# Patient Record
Sex: Female | Born: 1995
Health system: Southern US, Community
[De-identification: ages and names within clinical notes are randomized; demographics above are authoritative.]

## PROBLEM LIST (undated history)

## (undated) DIAGNOSIS — B019 Varicella without complication: Secondary | ICD-10-CM

## (undated) DIAGNOSIS — E063 Autoimmune thyroiditis: Secondary | ICD-10-CM

## (undated) HISTORY — DX: Varicella without complication: B01.9

## (undated) HISTORY — DX: Autoimmune thyroiditis: E06.3

---
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
## 2017-02-13 ENCOUNTER — Other Ambulatory Visit: Payer: Self-pay | Admitting: Internal Medicine

## 2017-02-13 DIAGNOSIS — E039 Hypothyroidism, unspecified: Secondary | ICD-10-CM | POA: Diagnosis not present

## 2017-02-14 LAB — T4 AND TSH
T4, Total: 10.4 ug/dL (ref 4.5–12.0)
TSH: 3.4 u[IU]/mL (ref 0.450–4.500)

## 2017-02-17 ENCOUNTER — Other Ambulatory Visit: Payer: Self-pay | Admitting: Internal Medicine

## 2017-02-18 ENCOUNTER — Other Ambulatory Visit: Payer: Self-pay | Admitting: Emergency Medicine

## 2017-02-18 MED ORDER — LEVOTHYROXINE SODIUM 137 MCG PO TABS: 137.0000 ug | ORAL_TABLET | Freq: Every day | ORAL | 1 refills | Status: DC

## 2017-02-20 ENCOUNTER — Telehealth: Payer: Self-pay | Admitting: Emergency Medicine

## 2017-02-20 DIAGNOSIS — R7611 Nonspecific reaction to tuberculin skin test without active tuberculosis: Secondary | ICD-10-CM

## 2017-02-20 NOTE — Telephone Encounter (Signed)
Pts mother is requesting a written RX for labs to be done before pts appt in Aug.

## 2017-02-21 NOTE — Telephone Encounter (Signed)
Mailed to pt at mothers request.

## 2017-02-21 NOTE — Telephone Encounter (Signed)
rx written

## 2017-02-26 ENCOUNTER — Telehealth: Payer: Self-pay | Admitting: Internal Medicine

## 2017-02-26 NOTE — Telephone Encounter (Signed)
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 Her TB test away shows a false positive. She is wanting a order for the lab & the chest xray to be done to show she does not have TB. She would like to pick up the orders to do her insurance does not cover our lab and xray. She also would like the lab order for her T4 and TSH as well. Please advise. Thank you.   Please contact patient at [phone]

## 2017-02-26 NOTE — Telephone Encounter (Signed)
Lets do the blood test - if positive - will need CXR.  Order pending - can be sent to [facility] from our lab if she wants to come here

## 2017-02-26 NOTE — Telephone Encounter (Signed)
Spoke with pt, states she would still like to come to get the RX for labs after I informed that labs can be sent to [facility]. Please write out RX

## 2017-02-26 NOTE — Telephone Encounter (Signed)
cxr and tb blood test written scripts

## 2017-02-26 NOTE — Telephone Encounter (Signed)
Spoke with pt states she had a pos TB test in 2015. [facility] and will need either the blood work or the chest x-ray for documentation. Please advise on which should be done.

## 2017-02-26 NOTE — Addendum Note (Signed)
Addended by: Pincus SanesBURNS, Janaysia Mcleroy J on: 02/26/2017 10:03 AM   Modules accepted: Orders

## 2017-02-26 NOTE — Telephone Encounter (Signed)
Placed up front for pickup ° °

## 2017-02-26 NOTE — Telephone Encounter (Signed)
Patient called back stating that she needs a script for a chest x ray b/c TB test come back positive.  Requesting to pick this up.  As well as a copy of the scripts that were mailed to her for labs.  States x ray script needs to be a separate script.  Please follow up at [phone].

## 2017-02-26 NOTE — Telephone Encounter (Signed)
See other phone note for Documentation

## 2017-02-27 ENCOUNTER — Other Ambulatory Visit: Payer: Self-pay | Admitting: Internal Medicine

## 2017-02-27 DIAGNOSIS — Z7689 Persons encountering health services in other specified circumstances: Secondary | ICD-10-CM | POA: Diagnosis not present

## 2017-02-27 NOTE — Addendum Note (Signed)
Addended by: Pincus SanesBURNS, Altamese Deguire J on: 02/27/2017 07:30 AM   Modules accepted: Orders

## 2017-03-04 LAB — QUANTIFERON IN TUBE
QUANTIFERON MITOGEN VALUE: 8.34 [IU]/mL
QUANTIFERON TB AG VALUE: 0.03 IU/mL
QUANTIFERON TB GOLD: NEGATIVE
Quantiferon Nil Value: 0.04 IU/mL

## 2017-03-04 LAB — QUANTIFERON TB GOLD ASSAY (BLOOD)

## 2017-03-04 NOTE — Telephone Encounter (Signed)
Per Dr Lawerance BachBurns okay to give results to Melanie Scott. Spoke with Melanie Scott, she will pick results up at front desk.

## 2017-03-04 NOTE — Telephone Encounter (Signed)
Please advise, I dont think the results are back yet.

## 2017-03-04 NOTE — Telephone Encounter (Signed)
Patient is waiting to know what her tb results are. And she would like to pick them up. She needs to turn them in somewhere. Thank you.

## 2017-05-08 ENCOUNTER — Other Ambulatory Visit: Payer: Self-pay | Admitting: Internal Medicine

## 2017-05-08 DIAGNOSIS — E063 Autoimmune thyroiditis: Secondary | ICD-10-CM | POA: Diagnosis not present

## 2017-05-09 LAB — CBC/DIFF AMBIGUOUS DEFAULT
BASOS ABS: 0 10*3/uL (ref 0.0–0.2)
BASOS: 0 %
EOS (ABSOLUTE): 0.6 10*3/uL — ABNORMAL HIGH (ref 0.0–0.4)
EOS: 7 %
HEMOGLOBIN: 13.3 g/dL (ref 11.1–15.9)
Hematocrit: 38.3 % (ref 34.0–46.6)
IMMATURE GRANS (ABS): 0 10*3/uL (ref 0.0–0.1)
IMMATURE GRANULOCYTES: 0 %
Lymphocytes Absolute: 4 10*3/uL — ABNORMAL HIGH (ref 0.7–3.1)
Lymphs: 48 %
MCH: 26.7 pg (ref 26.6–33.0)
MCHC: 34.7 g/dL (ref 31.5–35.7)
MCV: 77 fL — ABNORMAL LOW (ref 79–97)
MONOCYTES: 6 %
Monocytes Absolute: 0.5 10*3/uL (ref 0.1–0.9)
NEUTROS ABS: 3.2 10*3/uL (ref 1.4–7.0)
Neutrophils: 39 %
PLATELETS: 346 10*3/uL (ref 150–379)
RBC: 4.99 x10E6/uL (ref 3.77–5.28)
RDW: 13.1 % (ref 12.3–15.4)
WBC: 8.3 10*3/uL (ref 3.4–10.8)

## 2017-05-09 LAB — LIPID PANEL W/O CHOL/HDL RATIO
Cholesterol, Total: 238 mg/dL — ABNORMAL HIGH (ref 100–169)
HDL: 53 mg/dL (ref >39)
LDL CALC: 155 mg/dL — AB (ref 0–109)
Triglycerides: 150 mg/dL — ABNORMAL HIGH (ref 0–89)
VLDL CHOLESTEROL CAL: 30 mg/dL (ref 5–40)

## 2017-05-09 LAB — COMPREHENSIVE METABOLIC PANEL
ALBUMIN: 3.9 g/dL (ref 3.5–5.5)
ALT: 27 IU/L (ref 0–32)
AST: 23 IU/L (ref 0–40)
Albumin/Globulin Ratio: 1.4 (ref 1.2–2.2)
Alkaline Phosphatase: 90 IU/L (ref 39–117)
BILIRUBIN TOTAL: 0.3 mg/dL (ref 0.0–1.2)
BUN / CREAT RATIO: 13 (ref 9–23)
BUN: 11 mg/dL (ref 6–20)
CALCIUM: 9.6 mg/dL (ref 8.7–10.2)
CHLORIDE: 102 mmol/L (ref 96–106)
CO2: 19 mmol/L — AB (ref 20–29)
CREATININE: 0.83 mg/dL (ref 0.57–1.00)
GFR calc Af Amer: 118 mL/min/{1.73_m2} (ref >59)
GFR, EST NON AFRICAN AMERICAN: 103 mL/min/{1.73_m2} (ref >59)
GLUCOSE: 82 mg/dL (ref 65–99)
Globulin, Total: 2.8 g/dL (ref 1.5–4.5)
Potassium: 4.1 mmol/L (ref 3.5–5.2)
Sodium: 138 mmol/L (ref 134–144)
TOTAL PROTEIN: 6.7 g/dL (ref 6.0–8.5)

## 2017-05-09 LAB — T3, FREE: T3, Free: 2.9 pg/mL (ref 2.3–5.0)

## 2017-05-09 LAB — T4, FREE: FREE T4: 1.55 ng/dL (ref 0.93–1.60)

## 2017-05-09 LAB — AMBIG ABBREV CMP14 DEFAULT

## 2017-05-09 LAB — TSH: TSH: 2.34 u[IU]/mL (ref 0.450–4.500)

## 2017-05-14 NOTE — Progress Notes (Signed)
Subjective:    Patient ID: Melanie MoellerEyla Brandenberger, female    DOB: 07/24/1996, 21 y.o.   MRN: [id number]  HPI She is here for a physical exam.   She exercises irregularly depending on her school schedule.  She does not eat a lot of fried foods, but has a sweet tooth.   Wants to try a different birth control.  She gets nauseous at times with the birth control, especially after drinking alcohol.   Sleep difficulties:  She sometimes has difficulty falling asleep, but that is better.  She is a light sleeper and wakes up easy.  She usually falls asleep again but is not always in a deep sleep.  She tried an otc sleep medication.  She does feel anxious/ stressed at times.   Some anxiety and depression;  She states some variability in mood.   She has some anxiety at times and at times she feels down.  She often can not sleep when she is anxious about something.  She has never been on medication in the past.   Medications and allergies reviewed with patient and updated if appropriate.  Patient Active Problem List   Diagnosis Date Noted  . Adjustment disorder with mixed anxiety and depressed mood 05/15/2017  . Elevated cholesterol 05/23/2016  . Multiple thyroid nodules 12/13/2015  . Eczema 12/06/2015  . Seasonal allergic rhinitis 07/06/2014  . Autoimmune hypothyroidism     Current Outpatient Prescriptions on File Prior to Visit  Medication Sig Dispense Refill  . cetirizine (ZYRTEC) 10 MG tablet Take 1 tablet (10 mg total) by mouth daily. 90 tablet 3  . levothyroxine (SYNTHROID, LEVOTHROID) 137 MCG tablet Take 1 tablet (137 mcg total) by mouth daily before breakfast. 90 tablet 1  . mometasone (ELOCON) 0.1 % cream Apply 1 application topically daily. 45 g 3  . norethindrone-ethinyl estradiol-iron (MICROGESTIN FE 1.5/30) 1.5-30 MG-MCG tablet Take 1 tablet by mouth daily. 3 Package 3  . tacrolimus (PROTOPIC) 0.1 % ointment Apply topically 2 (two) times daily as needed. 100 g 3   No current  facility-administered medications on file prior to visit.     Past Medical History:  Diagnosis Date  . Autoimmune hypothyroidism    s/p R thyridectomy 2011 (benign nodules)  . Chicken pox     Past Surgical History:  Procedure Laterality Date  . left lobe thyroidectomy  03/2016  . Right Lobe Thyroidectomy  2011   benign nodules on thyroid    Social History   Social History  . Marital status: Single    Spouse name: N/A  . Number of children: N/A  . Years of education: 11 years   Occupational History  . high school student    Social History Main Topics  . Smoking status: Never Smoker  . Smokeless tobacco: Never Used  . Alcohol use No  . Drug use: No  . Sexual activity: Not on file   Other Topics Concern  . Not on file   Social History Narrative   Lives with mom, dad and sister   [place]      Regular exercise    Family History  Problem Relation Age of Onset  . Hyperlipidemia Other   . Hypertension Other   . Cancer Other        Breast Cancer    Review of Systems  Constitutional: Positive for fatigue. Negative for chills and fever.  Eyes: Negative for visual disturbance.  Respiratory: Negative for cough, shortness of breath and wheezing.  Cardiovascular: Negative for chest pain, palpitations and leg swelling.  Gastrointestinal: Negative for abdominal pain, blood in stool, constipation, diarrhea and nausea.  Genitourinary: Negative for dysuria and hematuria.  Musculoskeletal: Positive for back pain (with menses). Negative for arthralgias.  Skin: Negative for color change.  Neurological: Negative for light-headedness and headaches.  Psychiatric/Behavioral: Positive for dysphoric mood and sleep disturbance. Negative for decreased concentration. The patient is nervous/anxious.        Objective:   Vitals:   05/15/17 0927  BP: 112/78  Pulse: 77  Resp: 16  Temp: 97.8 F (36.6 C)  SpO2: 98%   Filed Weights   05/15/17 0927  Weight: 148 lb  (67.1 kg)   Body mass index is 27.07 kg/m.  Wt Readings from Last 3 Encounters:  05/15/17 148 lb (67.1 kg) (80 %, Z= 0.82)*  05/23/16 141 lb (64 kg) (75 %, Z= 0.69)*  12/06/15 139 lb (63 kg) (75 %, Z= 0.66)*   * Growth percentiles are based on CDC 2-20 Years data.     Physical Exam Constitutional: She appears well-developed and well-nourished. No distress.  HENT:  Head: Normocephalic and atraumatic.  Right Ear: External ear normal. Normal ear canal and TM Left Ear: External ear normal.  Normal ear canal and TM Mouth/Throat: Oropharynx is clear and moist.  Eyes: Conjunctivae and EOM are normal.  Neck: Neck supple. No tracheal deviation present. No thyromegaly present.  No carotid bruit  Cardiovascular: Normal rate, regular rhythm and normal heart sounds.   No murmur heard.  No edema. Pulmonary/Chest: Effort normal and breath sounds normal. No respiratory distress. She has no wheezes. She has no rales.  Breast: deferred  Abdominal: Soft. She exhibits no distension. There is no tenderness.  Lymphadenopathy: She has no cervical adenopathy.  Skin: Skin is warm and dry. She is not diaphoretic.  Psychiatric: She has a normal mood and affect. Her behavior is normal.         Assessment & Plan:   Physical exam: Screening blood work - reviewed her blood work Immunizations  Up to date  [employer] - has never seen one, not sexually active Wants to try a different birth control - will switch to sprintec Exercise  irregular Weight - overwieght Skin - no concerns Substance abuse    See Problem List for Assessment and Plan of chronic medical problems.

## 2017-05-15 ENCOUNTER — Encounter: Payer: Self-pay | Admitting: Internal Medicine

## 2017-05-15 ENCOUNTER — Ambulatory Visit (INDEPENDENT_AMBULATORY_CARE_PROVIDER_SITE_OTHER): Payer: 59 | Admitting: Internal Medicine

## 2017-05-15 VITALS — BP 112/78 | HR 77 | Temp 97.8°F | Resp 16 | Ht 62.0 in | Wt 148.0 lb

## 2017-05-15 DIAGNOSIS — E063 Autoimmune thyroiditis: Secondary | ICD-10-CM

## 2017-05-15 DIAGNOSIS — F4323 Adjustment disorder with mixed anxiety and depressed mood: Secondary | ICD-10-CM | POA: Diagnosis not present

## 2017-05-15 DIAGNOSIS — Z0001 Encounter for general adult medical examination with abnormal findings: Secondary | ICD-10-CM

## 2017-05-15 DIAGNOSIS — Z0000 Encounter for general adult medical examination without abnormal findings: Secondary | ICD-10-CM

## 2017-05-15 DIAGNOSIS — E7800 Pure hypercholesterolemia, unspecified: Secondary | ICD-10-CM

## 2017-05-15 MED ORDER — LEVOTHYROXINE SODIUM 137 MCG PO TABS: 137.0000 ug | ORAL_TABLET | Freq: Every day | ORAL | 1 refills | Status: DC

## 2017-05-15 MED ORDER — NORGESTIMATE-ETH ESTRADIOL 0.25-35 MG-MCG PO TABS: 1.0000 | ORAL_TABLET | Freq: Every day | ORAL | 11 refills | Status: DC

## 2017-05-15 MED ORDER — MOMETASONE FUROATE 0.1 % EX CREA: 1.0000 "application " | TOPICAL_CREAM | Freq: Every day | CUTANEOUS | 3 refills | Status: DC

## 2017-05-15 NOTE — Patient Instructions (Addendum)
Try melatonin for your sleep.  Take 2 mg - 6 mg at night.   Make sure you are exercising regularly.   We reviewed your blood work.   All other Health Maintenance issues reviewed.   All recommended immunizations and age-appropriate screenings are up-to-date or discussed.  No immunizations administered today.   Medications reviewed and updated.  Changes include switching to a different birth control  Your prescription(s) have been printed and given to you. Please take as directed and contact our office if you believe you are having problem(s) with the medication(s).   Please followup in 6 months   Health Maintenance, Female Adopting a healthy lifestyle and getting preventive care can go a long way to promote health and wellness. Talk with your health care provider about what schedule of regular examinations is right for you. This is a good chance for you to check in with your provider about disease prevention and staying healthy. In between checkups, there are plenty of things you can do on your own. Experts have done a lot of research about which lifestyle changes and preventive measures are most likely to keep you healthy. Ask your health care provider for more information. Weight and diet Eat a healthy diet  Be sure to include plenty of vegetables, fruits, low-fat dairy products, and lean protein.  Do not eat a lot of foods high in solid fats, added sugars, or salt.  Get regular exercise. This is one of the most important things you can do for your health. ? Most adults should exercise for at least 150 minutes each week. The exercise should increase your heart rate and make you sweat (moderate-intensity exercise). ? Most adults should also do strengthening exercises at least twice a week. This is in addition to the moderate-intensity exercise.  Maintain a healthy weight  Body mass index (BMI) is a measurement that can be used to identify possible weight problems. It estimates body  fat based on height and weight. Your health care provider can help determine your BMI and help you achieve or maintain a healthy weight.  For females 56 years of age and older: ? A BMI below 18.5 is considered underweight. ? A BMI of 18.5 to 24.9 is normal. ? A BMI of 25 to 29.9 is considered overweight. ? A BMI of 30 and above is considered obese.  Watch levels of cholesterol and blood lipids  You should start having your blood tested for lipids and cholesterol at 19 years of age, then have this test every 5 years.  You may need to have your cholesterol levels checked more often if: ? Your lipid or cholesterol levels are high. ? You are older than 19 years of age. ? You are at high risk for heart disease.  Cancer screening Lung Cancer  Lung cancer screening is recommended for adults 23-6 years old who are at high risk for lung cancer because of a history of smoking.  A yearly low-dose CT scan of the lungs is recommended for people who: ? Currently smoke. ? Have quit within the past 15 years. ? Have at least a 30-pack-year history of smoking. A pack year is smoking an average of one pack of cigarettes a day for 1 year.  Yearly screening should continue until it has been 15 years since you quit.  Yearly screening should stop if you develop a health problem that would prevent you from having lung cancer treatment.  Breast Cancer  Practice breast self-awareness. This means understanding how  your breasts normally appear and feel.  It also means doing regular breast self-exams. Let your health care provider know about any changes, no matter how small.  If you are in your 20s or 30s, you should have a clinical breast exam (CBE) by a health care provider every 1-3 years as part of a regular health exam.  If you are 15 or older, have a CBE every year. Also consider having a breast X-ray (mammogram) every year.  If you have a family history of breast cancer, talk to your health care  provider about genetic screening.  If you are at high risk for breast cancer, talk to your health care provider about having an MRI and a mammogram every year.  Breast cancer gene (BRCA) assessment is recommended for women who have family members with BRCA-related cancers. BRCA-related cancers include: ? Breast. ? Ovarian. ? Tubal. ? Peritoneal cancers.  Results of the assessment will determine the need for genetic counseling and BRCA1 and BRCA2 testing.  Cervical Cancer Your health care provider may recommend that you be screened regularly for cancer of the pelvic organs (ovaries, uterus, and vagina). This screening involves a pelvic examination, including checking for microscopic changes to the surface of your cervix (Pap test). You may be encouraged to have this screening done every 3 years, beginning at age 38.  For women ages 62-65, health care providers may recommend pelvic exams and Pap testing every 3 years, or they may recommend the Pap and pelvic exam, combined with testing for human papilloma virus (HPV), every 5 years. Some types of HPV increase your risk of cervical cancer. Testing for HPV may also be done on women of any age with unclear Pap test results.  Other health care providers may not recommend any screening for nonpregnant women who are considered low risk for pelvic cancer and who do not have symptoms. Ask your health care provider if a screening pelvic exam is right for you.  If you have had past treatment for cervical cancer or a condition that could lead to cancer, you need Pap tests and screening for cancer for at least 20 years after your treatment. If Pap tests have been discontinued, your risk factors (such as having a new sexual partner) need to be reassessed to determine if screening should resume. Some women have medical problems that increase the chance of getting cervical cancer. In these cases, your health care provider may recommend more frequent screening and  Pap tests.  Colorectal Cancer  This type of cancer can be detected and often prevented.  Routine colorectal cancer screening usually begins at 19 years of age and continues through 19 years of age.  Your health care provider may recommend screening at an earlier age if you have risk factors for colon cancer.  Your health care provider may also recommend using home test kits to check for hidden blood in the stool.  A small camera at the end of a tube can be used to examine your colon directly (sigmoidoscopy or colonoscopy). This is done to check for the earliest forms of colorectal cancer.  Routine screening usually begins at age 5.  Direct examination of the colon should be repeated every 5-10 years through 19 years of age. However, you may need to be screened more often if early forms of precancerous polyps or small growths are found.  Skin Cancer  Check your skin from head to toe regularly.  Tell your health care provider about any new moles or changes  in moles, especially if there is a change in a mole's shape or color.  Also tell your health care provider if you have a mole that is larger than the size of a pencil eraser.  Always use sunscreen. Apply sunscreen liberally and repeatedly throughout the day.  Protect yourself by wearing long sleeves, pants, a wide-brimmed hat, and sunglasses whenever you are outside.  Heart disease, diabetes, and high blood pressure  High blood pressure causes heart disease and increases the risk of stroke. High blood pressure is more likely to develop in: ? People who have blood pressure in the high end of the normal range (130-139/85-89 mm Hg). ? People who are overweight or obese. ? People who are African American.  If you are 34-23 years of age, have your blood pressure checked every 3-5 years. If you are 35 years of age or older, have your blood pressure checked every year. You should have your blood pressure measured twice-once when you  are at a hospital or clinic, and once when you are not at a hospital or clinic. Record the average of the two measurements. To check your blood pressure when you are not at a hospital or clinic, you can use: ? An automated blood pressure machine at a pharmacy. ? A home blood pressure monitor.  If you are between 9 years and 38 years old, ask your health care provider if you should take aspirin to prevent strokes.  Have regular diabetes screenings. This involves taking a blood sample to check your fasting blood sugar level. ? If you are at a normal weight and have a low risk for diabetes, have this test once every three years after 19 years of age. ? If you are overweight and have a high risk for diabetes, consider being tested at a younger age or more often. Preventing infection Hepatitis B  If you have a higher risk for hepatitis B, you should be screened for this virus. You are considered at high risk for hepatitis B if: ? You were born in a country where hepatitis B is common. Ask your health care provider which countries are considered high risk. ? Your parents were born in a high-risk country, and you have not been immunized against hepatitis B (hepatitis B vaccine). ? You have HIV or AIDS. ? You use needles to inject street drugs. ? You live with someone who has hepatitis B. ? You have had sex with someone who has hepatitis B. ? You get hemodialysis treatment. ? You take certain medicines for conditions, including cancer, organ transplantation, and autoimmune conditions.  Hepatitis C  Blood testing is recommended for: ? Everyone born from [birth year] through 1963. ? Anyone with known risk factors for hepatitis C.  Sexually transmitted infections (STIs)  You should be screened for sexually transmitted infections (STIs) including gonorrhea and chlamydia if: ? You are sexually active and are younger than 19 years of age. ? You are older than 19 years of age and your health care provider  tells you that you are at risk for this type of infection. ? Your sexual activity has changed since you were last screened and you are at an increased risk for chlamydia or gonorrhea. Ask your health care provider if you are at risk.  If you do not have HIV, but are at risk, it may be recommended that you take a prescription medicine daily to prevent HIV infection. This is called pre-exposure prophylaxis (PrEP). You are considered at risk if: ? You  are sexually active and do not regularly use condoms or know the HIV status of your partner(s). ? You take drugs by injection. ? You are sexually active with a partner who has HIV.  Talk with your health care provider about whether you are at high risk of being infected with HIV. If you choose to begin PrEP, you should first be tested for HIV. You should then be tested every 3 months for as long as you are taking PrEP. Pregnancy  If you are premenopausal and you may become pregnant, ask your health care provider about preconception counseling.  If you may become pregnant, take 400 to 800 micrograms (mcg) of folic acid every day.  If you want to prevent pregnancy, talk to your health care provider about birth control (contraception). Osteoporosis and menopause  Osteoporosis is a disease in which the bones lose minerals and strength with aging. This can result in serious bone fractures. Your risk for osteoporosis can be identified using a bone density scan.  If you are 29 years of age or older, or if you are at risk for osteoporosis and fractures, ask your health care provider if you should be screened.  Ask your health care provider whether you should take a calcium or vitamin D supplement to lower your risk for osteoporosis.  Menopause may have certain physical symptoms and risks.  Hormone replacement therapy may reduce some of these symptoms and risks. Talk to your health care provider about whether hormone replacement therapy is right for  you. Follow these instructions at home:  Schedule regular health, dental, and eye exams.  Stay current with your immunizations.  Do not use any tobacco products including cigarettes, chewing tobacco, or electronic cigarettes.  If you are pregnant, do not drink alcohol.  If you are breastfeeding, limit how much and how often you drink alcohol.  Limit alcohol intake to no more than 1 drink per day for nonpregnant women. One drink equals 12 ounces of beer, 5 ounces of wine, or 1 ounces of hard liquor.  Do not use street drugs.  Do not share needles.  Ask your health care provider for help if you need support or information about quitting drugs.  Tell your health care provider if you often feel depressed.  Tell your health care provider if you have ever been abused or do not feel safe at home. This information is not intended to replace advice given to you by your health care provider. Make sure you discuss any questions you have with your health care provider. Document Released: 04/08/2011 Document Revised: 02/29/2016 Document Reviewed: 06/27/2015 Elsevier Interactive Patient Education  [employer].

## 2017-05-15 NOTE — Assessment & Plan Note (Signed)
She is experiencing some anxiety and depression - it is mild Stressed regular exercise and stress relieving activities Discussed medication - she deferred for now

## 2017-05-15 NOTE — Assessment & Plan Note (Signed)
Elevated - possibly genetically driven No medication at this age Stressed regular exercise Low fat/cholesterol diet

## 2017-05-15 NOTE — Assessment & Plan Note (Signed)
Tsh, ft3 and ft4 in normal range Continue levothyroxine 137 mcg

## 2017-07-01 ENCOUNTER — Telehealth: Payer: Self-pay | Admitting: Internal Medicine

## 2017-07-01 NOTE — Telephone Encounter (Signed)
Pt called in and has some question about her meds that was sent in. norgestimate-ethinyl estradiol (SPRINTEC 28) 0.25-35 MG-MCG tablet [[id number]  Need 3 month supply per ins  Has some question.  Would like a nurse to call her   Best number [phone]

## 2017-07-02 MED ORDER — NORGESTIMATE-ETH ESTRADIOL 0.25-35 MG-MCG PO TABS: 1.0000 | ORAL_TABLET | Freq: Every day | ORAL | 2 refills | Status: DC

## 2017-07-02 NOTE — Telephone Encounter (Signed)
LVM informing pt that new RX is printed for 3 month supply and will be placed up front for pick-up

## 2017-07-23 NOTE — Progress Notes (Signed)
Subjective:    Patient ID: Melanie Scott, female    DOB: 11/27/1995, 21 y.o.   MRN: [id number]  HPI The patient is here for follow up.  Hypothyroidism:  She is taking her medication daily.  She denies any recent changes in energy or weight that are unexplained.  She does have intermittent fatigue, but it is not new and she feels it may be related to mild depression.   Mild depression, anxiety: [place] she has been experiencing intermittent feelings of depression and loneliness. This has been going on for a while, but it has been persistently intermittent. She was hoping it would resolve on its own, but it has not. She does feel overwhelmed and anxious at times. She thinks there probably is some underlying stress. Not long ago she had a panic attack and feels it was related to feeling overwhelmed. She thinks she needs to do something. She did talk to a counselor at school last year and it helped, but they were only able to deal with some of her concerns. She denies any suicidal thoughts. She has never been on medication, but would like to consider it.   Medications and allergies reviewed with patient and updated if appropriate.  Patient Active Problem List   Diagnosis Date Noted  . Adjustment disorder with mixed anxiety and depressed mood 05/15/2017  . Elevated cholesterol 05/23/2016  . Multiple thyroid nodules 12/13/2015  . Eczema 12/06/2015  . Seasonal allergic rhinitis 07/06/2014  . Autoimmune hypothyroidism     Current Outpatient Prescriptions on File Prior to Visit  Medication Sig Dispense Refill  . cetirizine (ZYRTEC) 10 MG tablet Take 1 tablet (10 mg total) by mouth daily. 90 tablet 3  . levothyroxine (SYNTHROID, LEVOTHROID) 137 MCG tablet Take 1 tablet (137 mcg total) by mouth daily before breakfast. 90 tablet 1  . mometasone (ELOCON) 0.1 % cream Apply 1 application topically daily. 45 g 3  . norgestimate-ethinyl estradiol (SPRINTEC 28) 0.25-35 MG-MCG tablet Take 1 tablet by  mouth daily. 3 Package 2   No current facility-administered medications on file prior to visit.     Past Medical History:  Diagnosis Date  . Autoimmune hypothyroidism    s/p R thyridectomy 2011 (benign nodules)  . Chicken pox     Past Surgical History:  Procedure Laterality Date  . left lobe thyroidectomy  03/2016  . Right Lobe Thyroidectomy  2011   benign nodules on thyroid    Social History   Social History  . Marital status: Single    Spouse name: N/A  . Number of children: N/A  . Years of education: 11 years   Occupational History  . high school student    Social History Main Topics  . Smoking status: Never Smoker  . Smokeless tobacco: Never Used  . Alcohol use No  . Drug use: No  . Sexual activity: Not Asked   Other Topics Concern  . None   Social History Narrative   Lives with mom, dad and sister   [place]      Regular exercise    Family History  Problem Relation Age of Onset  . Hyperlipidemia Other   . Hypertension Other   . Cancer Other        Breast Cancer    Review of Systems  Constitutional: Negative for fatigue (variable - sometimes low).  Cardiovascular: Negative for chest pain and palpitations.  Gastrointestinal: Negative for nausea.  Neurological: Negative for headaches.  Objective:   Vitals:   07/24/17 1011  BP: 120/78  Pulse: 74  Resp: 16  Temp: 98.4 F (36.9 C)  SpO2: 98%   Wt Readings from Last 3 Encounters:  07/24/17 147 lb (66.7 kg) (78 %, Z= 0.77)*  05/15/17 148 lb (67.1 kg) (80 %, Z= 0.82)*  05/23/16 141 lb (64 kg) (75 %, Z= 0.69)*   * Growth percentiles are based on CDC 2-20 Years data.   Body mass index is 26.89 kg/m.   Physical Exam  Constitutional: She appears well-developed and well-nourished. No distress.  HENT:  Head: Normocephalic and atraumatic.  Skin: She is not diaphoretic.  Psychiatric: She has a normal mood and affect. Her behavior is normal. Judgment and thought content normal.            Assessment & Plan:   Flu vaccine today  See Problem List for Assessment and Plan of chronic medical problems.

## 2017-07-24 ENCOUNTER — Ambulatory Visit (INDEPENDENT_AMBULATORY_CARE_PROVIDER_SITE_OTHER): Payer: 59 | Admitting: Internal Medicine

## 2017-07-24 ENCOUNTER — Encounter: Payer: Self-pay | Admitting: Internal Medicine

## 2017-07-24 ENCOUNTER — Ambulatory Visit: Payer: 59

## 2017-07-24 VITALS — BP 120/78 | HR 74 | Temp 98.4°F | Resp 16 | Ht 62.0 in | Wt 147.0 lb

## 2017-07-24 DIAGNOSIS — Z23 Encounter for immunization: Secondary | ICD-10-CM | POA: Diagnosis not present

## 2017-07-24 DIAGNOSIS — E063 Autoimmune thyroiditis: Secondary | ICD-10-CM | POA: Diagnosis not present

## 2017-07-24 DIAGNOSIS — F4323 Adjustment disorder with mixed anxiety and depressed mood: Secondary | ICD-10-CM

## 2017-07-24 MED ORDER — ESCITALOPRAM OXALATE 10 MG PO TABS: 10.0000 mg | ORAL_TABLET | Freq: Every day | ORAL | 1 refills | Status: DC

## 2017-07-24 NOTE — Assessment & Plan Note (Signed)
Last TSH in normal range Continue current dose of levothyroxine 

## 2017-07-24 NOTE — Patient Instructions (Signed)
Flu immunization administered today.   Medications reviewed and updated.  Changes include starting lexapro 10 mg daily.   Your prescription(s) have been submitted to your pharmacy. Please take as directed and contact our office if you believe you are having problem(s) with the medication(s).   Please followup in 1-2 month

## 2017-07-24 NOTE — Assessment & Plan Note (Addendum)
She has a mixture of depression and anxiety This is not new, but something that has been intermittent over the years She has done some brief therapy and it has helped and she may need to see a private therapist instead of a school therapist to address all of her concerns Continue regular exercise Discussed medications-we will try Lexapro 10 mg daily Discussed possible side effects Follow-up in 1 -2 months, sooner if needed

## 2017-08-10 ENCOUNTER — Other Ambulatory Visit: Payer: Self-pay | Admitting: Internal Medicine

## 2017-10-10 DIAGNOSIS — J209 Acute bronchitis, unspecified: Secondary | ICD-10-CM | POA: Diagnosis not present

## 2017-10-10 DIAGNOSIS — R0602 Shortness of breath: Secondary | ICD-10-CM | POA: Diagnosis not present

## 2017-10-10 DIAGNOSIS — R509 Fever, unspecified: Secondary | ICD-10-CM | POA: Diagnosis not present

## 2017-11-29 DIAGNOSIS — B9789 Other viral agents as the cause of diseases classified elsewhere: Secondary | ICD-10-CM | POA: Diagnosis not present

## 2017-11-29 DIAGNOSIS — J069 Acute upper respiratory infection, unspecified: Secondary | ICD-10-CM | POA: Diagnosis not present

## 2017-12-12 ENCOUNTER — Other Ambulatory Visit: Payer: Self-pay | Admitting: Internal Medicine

## 2017-12-12 ENCOUNTER — Other Ambulatory Visit: Payer: Self-pay | Admitting: Emergency Medicine

## 2017-12-12 MED ORDER — NORGESTIMATE-ETH ESTRADIOL 0.25-35 MG-MCG PO TABS: 1.0000 | ORAL_TABLET | Freq: Every day | ORAL | 2 refills | Status: DC

## 2017-12-12 NOTE — Telephone Encounter (Signed)
Are you going to continue to fill this or does pt see GYN?

## 2018-01-01 ENCOUNTER — Other Ambulatory Visit: Payer: Self-pay | Admitting: Internal Medicine

## 2018-05-06 ENCOUNTER — Telehealth: Payer: Self-pay | Admitting: Internal Medicine

## 2018-05-06 DIAGNOSIS — E7800 Pure hypercholesterolemia, unspecified: Secondary | ICD-10-CM

## 2018-05-06 DIAGNOSIS — Z0000 Encounter for general adult medical examination without abnormal findings: Secondary | ICD-10-CM

## 2018-05-06 DIAGNOSIS — E042 Nontoxic multinodular goiter: Secondary | ICD-10-CM

## 2018-05-06 DIAGNOSIS — E063 Autoimmune thyroiditis: Secondary | ICD-10-CM

## 2018-05-06 NOTE — Telephone Encounter (Signed)
Written RX is easiest for her. Im unsure where she takes orders to.

## 2018-05-06 NOTE — Telephone Encounter (Signed)
Please order and print out labs: tsh, ft4, ft3, cmp, cbc, tsh and lipid panel   Dx  E06.3, E04.2, Z00.00, E78.00

## 2018-05-06 NOTE — Telephone Encounter (Signed)
Copied from CRM [phone]. Topic: Inquiry >> May 06, 2018  1:54 PM Yvonna Alanisobinson, Andra M wrote: Reason for CRM: Patient called wanting to know if Dr. Lawerance BachBurns would provide a written lab orders to have her lab before her physical. Please call patient at [phone].       Thank You!!!

## 2018-05-07 NOTE — Telephone Encounter (Signed)
Spoke with pt, has labs done at [facility]. Orders printed and placed upfront for pick-up

## 2018-05-07 NOTE — Telephone Encounter (Signed)
Left mess for patient to call back.  

## 2018-05-07 NOTE — Telephone Encounter (Signed)
Patient return called and stated she is available to speak with stacy in regards to her labs.please advise

## 2018-05-14 DIAGNOSIS — E7800 Pure hypercholesterolemia, unspecified: Secondary | ICD-10-CM | POA: Diagnosis not present

## 2018-05-14 DIAGNOSIS — E042 Nontoxic multinodular goiter: Secondary | ICD-10-CM | POA: Diagnosis not present

## 2018-05-14 DIAGNOSIS — E063 Autoimmune thyroiditis: Secondary | ICD-10-CM | POA: Diagnosis not present

## 2018-05-14 DIAGNOSIS — Z0000 Encounter for general adult medical examination without abnormal findings: Secondary | ICD-10-CM | POA: Diagnosis not present

## 2018-05-15 LAB — COMPREHENSIVE METABOLIC PANEL
A/G RATIO: 1.4 (ref 1.2–2.2)
ALK PHOS: 103 IU/L (ref 39–117)
ALT: 28 IU/L (ref 0–32)
AST: 23 IU/L (ref 0–40)
Albumin: 4 g/dL (ref 3.5–5.5)
BUN/Creatinine Ratio: 10 (ref 9–23)
BUN: 7 mg/dL (ref 6–20)
CHLORIDE: 103 mmol/L (ref 96–106)
CO2: 20 mmol/L (ref 20–29)
Calcium: 9.4 mg/dL (ref 8.7–10.2)
Creatinine, Ser: 0.73 mg/dL (ref 0.57–1.00)
GFR calc Af Amer: 137 mL/min/{1.73_m2} (ref >59)
GFR calc non Af Amer: 119 mL/min/{1.73_m2} (ref >59)
GLUCOSE: 81 mg/dL (ref 65–99)
Globulin, Total: 2.8 g/dL (ref 1.5–4.5)
POTASSIUM: 4.3 mmol/L (ref 3.5–5.2)
SODIUM: 139 mmol/L (ref 134–144)
Total Protein: 6.8 g/dL (ref 6.0–8.5)

## 2018-05-15 LAB — CBC WITH DIFFERENTIAL/PLATELET
BASOS: 0 %
Basophils Absolute: 0 10*3/uL (ref 0.0–0.2)
EOS (ABSOLUTE): 0.5 10*3/uL — ABNORMAL HIGH (ref 0.0–0.4)
EOS: 8 %
HEMATOCRIT: 39.9 % (ref 34.0–46.6)
HEMOGLOBIN: 13.2 g/dL (ref 11.1–15.9)
IMMATURE GRANULOCYTES: 0 %
Immature Grans (Abs): 0 10*3/uL (ref 0.0–0.1)
LYMPHS ABS: 3.9 10*3/uL — AB (ref 0.7–3.1)
Lymphs: 59 %
MCH: 26 pg — ABNORMAL LOW (ref 26.6–33.0)
MCHC: 33.1 g/dL (ref 31.5–35.7)
MCV: 79 fL (ref 79–97)
MONOCYTES: 6 %
MONOS ABS: 0.4 10*3/uL (ref 0.1–0.9)
Neutrophils Absolute: 1.8 10*3/uL (ref 1.4–7.0)
Neutrophils: 27 %
Platelets: 363 10*3/uL (ref 150–450)
RBC: 5.07 x10E6/uL (ref 3.77–5.28)
RDW: 13.4 % (ref 12.3–15.4)
WBC: 6.5 10*3/uL (ref 3.4–10.8)

## 2018-05-15 LAB — TSH: TSH: 0.108 u[IU]/mL — ABNORMAL LOW (ref 0.450–4.500)

## 2018-05-15 LAB — T3, FREE: T3, Free: 3.5 pg/mL (ref 2.0–4.4)

## 2018-05-15 LAB — LIPID PANEL
CHOL/HDL RATIO: 4.3 ratio (ref 0.0–4.4)
Cholesterol, Total: 234 mg/dL — ABNORMAL HIGH (ref 100–199)
HDL: 55 mg/dL (ref >39)
LDL CALC: 152 mg/dL — AB (ref 0–99)
TRIGLYCERIDES: 135 mg/dL (ref 0–149)
VLDL Cholesterol Cal: 27 mg/dL (ref 5–40)

## 2018-05-15 LAB — T4, FREE: Free T4: 1.66 ng/dL (ref 0.82–1.77)

## 2018-05-17 NOTE — Progress Notes (Signed)
Subjective:    Patient ID: Melanie Scott, female    DOB: 10/15/1995, 22 y.o.   MRN: [id number]  HPI She is here for a physical exam.   These past couple of months she has been very tired and has no energy.  She sleeps about 8 hrs, but it varies between 6-10 hrs.  She is trying to exercise regularly but has not been able to do it.  The past three days she has exercised.   Left breast lump:  She has a lump on the lateral aspect of her left breast.  It is tender at times, especially during her periods.  She is unsure if it has grown at al.  She denies other breast lumps.    Medications and allergies reviewed with patient and updated if appropriate.  Patient Active Problem List   Diagnosis Date Noted  . Left breast lump 05/18/2018  . Adjustment disorder with mixed anxiety and depressed mood 05/15/2017  . Elevated cholesterol 05/23/2016  . Multiple thyroid nodules 12/13/2015  . Eczema 12/06/2015  . Seasonal allergic rhinitis 07/06/2014  . Autoimmune hypothyroidism     Current Outpatient Medications on File Prior to Visit  Medication Sig Dispense Refill  . cetirizine (ZYRTEC) 10 MG tablet Take 1 tablet (10 mg total) by mouth daily. 90 tablet 3  . mometasone (ELOCON) 0.1 % cream Apply 1 application topically daily. 45 g 3   No current facility-administered medications on file prior to visit.     Past Medical History:  Diagnosis Date  . Autoimmune hypothyroidism    s/p R thyridectomy 2011 (benign nodules)  . Chicken pox     Past Surgical History:  Procedure Laterality Date  . left lobe thyroidectomy  03/2016  . Right Lobe Thyroidectomy  2011   benign nodules on thyroid    Social History   Socioeconomic History  . Marital status: Single    Spouse name: Not on file  . Number of children: Not on file  . Years of education: 11 years  . Highest education level: Not on file  Occupational History  . 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 Financial resource  strain: Not on file  . Food insecurity:    Worry: Not on file    Inability: Not on file  . Transportation needs:    Medical: Not on file    Non-medical: Not on file  Tobacco Use  . Smoking status: Never Smoker  . Smokeless tobacco: Never Used  Substance and Sexual Activity  . Alcohol use: No  . Drug use: No  . Sexual activity: Not on file  Lifestyle  . Physical activity:    Days per week: Not on file    Minutes per session: Not on file  . Stress: Not on file  Relationships  . Social connections:    Talks on phone: Not on file    Gets together: Not on file    Attends religious service: Not on file    Active member of club or organization: Not on file    Attends meetings of clubs or organizations: Not on file    Relationship status: Not on file  Other Topics Concern  . Not on file  Social History Narrative   Lives with mom, dad and sister   [place]      Regular exercise    Family History  Problem Relation Age of Onset  . Hyperlipidemia Other   . Hypertension Other   .  Cancer Other        Breast Cancer    Review of Systems  Constitutional: Positive for fatigue. Negative for chills and fever.  Eyes: Negative for visual disturbance.  Respiratory: Negative for cough, shortness of breath and wheezing.   Cardiovascular: Negative for chest pain, palpitations and leg swelling.  Gastrointestinal: Positive for abdominal distention (intermittently depending on what she eats). Negative for abdominal pain, blood in stool, constipation, diarrhea and nausea.       No gerd  Genitourinary: Negative for dysuria and hematuria.  Musculoskeletal: Negative for arthralgias.  Skin: Negative for color change and rash.  Neurological: Positive for headaches (occasionally). Negative for light-headedness.  Psychiatric/Behavioral: Positive for dysphoric mood (controlled). The patient is nervous/anxious (controlled).        Objective:   Vitals:   05/18/18 0908  BP: 122/80    Pulse: 75  Resp: 16  Temp: 98 F (36.7 C)  SpO2: 98%   Filed Weights   05/18/18 0908  Weight: 143 lb (64.9 kg)   Body mass index is 26.16 kg/m.  Wt Readings from Last 3 Encounters:  05/18/18 143 lb (64.9 kg)  07/24/17 147 lb (66.7 kg) (78 %, Z= 0.77)*  05/15/17 148 lb (67.1 kg) (80 %, Z= 0.82)*   * Growth percentiles are based on CDC (Girls, 2-20 Years) data.     Physical Exam Constitutional: She appears well-developed and well-nourished. No distress.  HENT:  Head: Normocephalic and atraumatic.  Right Ear: External ear normal. Normal ear canal and TM Left Ear: External ear normal.  Normal ear canal and TM Mouth/Throat: Oropharynx is clear and moist.  Eyes: Conjunctivae and EOM are normal.  Neck: Neck supple. No tracheal deviation present. No thyromegaly present.  No carotid bruit  Cardiovascular: Normal rate, regular rhythm and normal heart sounds.   No murmur heard.  No edema. Pulmonary/Chest: Effort normal and breath sounds normal. No respiratory distress. She has no wheezes. She has no rales.  Breast: left lateral breast with a palpable lump that is mobile and non-tender - size of a walnut.  No overlying skin changes.  Abdominal: Soft. She exhibits no distension. There is no tenderness.  Lymphadenopathy: She has no cervical adenopathy.  Skin: Skin is warm and dry. She is not diaphoretic.  Psychiatric: She has a normal mood and affect. Her behavior is normal.        Assessment & Plan:   Physical exam: Screening blood work   reviewed Immunizations   Up to date  Gyn - has not established yet Exercise  Going to gym irregularly - trying to go more regularly Weight  Discussed keeping her weight down - has lost some weight over the past year Skin no concerns Substance abuse  none  See Problem List for Assessment and Plan of chronic medical problems.    FU in 6 months

## 2018-05-18 ENCOUNTER — Encounter

## 2018-05-18 ENCOUNTER — Ambulatory Visit (INDEPENDENT_AMBULATORY_CARE_PROVIDER_SITE_OTHER): Payer: 59 | Admitting: Internal Medicine

## 2018-05-18 ENCOUNTER — Encounter: Payer: Self-pay | Admitting: Internal Medicine

## 2018-05-18 VITALS — BP 122/80 | HR 75 | Temp 98.0°F | Resp 16 | Ht 62.0 in | Wt 143.0 lb

## 2018-05-18 DIAGNOSIS — L309 Dermatitis, unspecified: Secondary | ICD-10-CM

## 2018-05-18 DIAGNOSIS — F4323 Adjustment disorder with mixed anxiety and depressed mood: Secondary | ICD-10-CM

## 2018-05-18 DIAGNOSIS — E063 Autoimmune thyroiditis: Secondary | ICD-10-CM

## 2018-05-18 DIAGNOSIS — Z0000 Encounter for general adult medical examination without abnormal findings: Secondary | ICD-10-CM | POA: Diagnosis not present

## 2018-05-18 DIAGNOSIS — E042 Nontoxic multinodular goiter: Secondary | ICD-10-CM

## 2018-05-18 DIAGNOSIS — N6320 Unspecified lump in the left breast, unspecified quadrant: Secondary | ICD-10-CM | POA: Insufficient documentation

## 2018-05-18 DIAGNOSIS — E7800 Pure hypercholesterolemia, unspecified: Secondary | ICD-10-CM

## 2018-05-18 MED ORDER — NORGESTIMATE-ETH ESTRADIOL 0.25-35 MG-MCG PO TABS: 1.0000 | ORAL_TABLET | Freq: Every day | ORAL | 3 refills | Status: DC

## 2018-05-18 MED ORDER — LEVOTHYROXINE SODIUM 125 MCG PO TABS: 125.0000 ug | ORAL_TABLET | Freq: Every day | ORAL | 1 refills | Status: DC

## 2018-05-18 MED ORDER — ESCITALOPRAM OXALATE 10 MG PO TABS: 10.0000 mg | ORAL_TABLET | Freq: Every day | ORAL | 1 refills | Status: DC

## 2018-05-18 NOTE — Assessment & Plan Note (Signed)
Continue regular exercise Working on weight loss Eats fairly healthy Not a candidate for cholesterol lowering medication - work on lifestyle

## 2018-05-18 NOTE — Assessment & Plan Note (Signed)
She feels the lexapro is working well - she still has ups and downs, but overall feels her anxiety/depression are controlled. She is happy with her current dose Continue lexapro 10 mg daily

## 2018-05-18 NOTE — Patient Instructions (Addendum)
All other Health Maintenance issues reviewed.   All recommended immunizations and age-appropriate screenings are up-to-date or discussed.  No immunizations administered today.   Medications reviewed and updated.  Changes include decreasing your thyroid dose to 125 mcg.   An ultrasound was ordered -someone will call you to schedule this.   Please followup in 6 months   Health Maintenance, Female Adopting a healthy lifestyle and getting preventive care can go a long way to promote health and wellness. Talk with your health care provider about what schedule of regular examinations is right for you. This is a good chance for you to check in with your provider about disease prevention and staying healthy. In between checkups, there are plenty of things you can do on your own. Experts have done a lot of research about which lifestyle changes and preventive measures are most likely to keep you healthy. Ask your health care provider for more information. Weight and diet Eat a healthy diet  Be sure to include plenty of vegetables, fruits, low-fat dairy products, and lean protein.  Do not eat a lot of foods high in solid fats, added sugars, or salt.  Get regular exercise. This is one of the most important things you can do for your health. ? Most adults should exercise for at least 150 minutes each week. The exercise should increase your heart rate and make you sweat (moderate-intensity exercise). ? Most adults should also do strengthening exercises at least twice a week. This is in addition to the moderate-intensity exercise.  Maintain a healthy weight  Body mass index (BMI) is a measurement that can be used to identify possible weight problems. It estimates body fat based on height and weight. Your health care provider can help determine your BMI and help you achieve or maintain a healthy weight.  For females 20 years of age and older: ? A BMI below 18.5 is considered underweight. ? A BMI of  18.5 to 24.9 is normal. ? A BMI of 25 to 29.9 is considered overweight. ? A BMI of 30 and above is considered obese.  Watch levels of cholesterol and blood lipids  You should start having your blood tested for lipids and cholesterol at 20 years of age, then have this test every 5 years.  You may need to have your cholesterol levels checked more often if: ? Your lipid or cholesterol levels are high. ? You are older than 20 years of age. ? You are at high risk for heart disease.  Cancer screening Lung Cancer  Lung cancer screening is recommended for adults 20-65 years old who are at high risk for lung cancer because of a history of smoking.  A yearly low-dose CT scan of the lungs is recommended for people who: ? Currently smoke. ? Have quit within the past 15 years. ? Have at least a 30-pack-year history of smoking. A pack year is smoking an average of one pack of cigarettes a day for 1 year.  Yearly screening should continue until it has been 15 years since you quit.  Yearly screening should stop if you develop a health problem that would prevent you from having lung cancer treatment.  Breast Cancer  Practice breast self-awareness. This means understanding how your breasts normally appear and feel.  It also means doing regular breast self-exams. Let your health care provider know about any changes, no matter how small.  If you are in your 20s or 30s, you should have a clinical breast exam (CBE) by  a health care provider every 1-3 years as part of a regular health exam.  If you are 20 or older, have a CBE every year. Also consider having a breast X-ray (mammogram) every year.  If you have a family history of breast cancer, talk to your health care provider about genetic screening.  If you are at high risk for breast cancer, talk to your health care provider about having an MRI and a mammogram every year.  Breast cancer gene (BRCA) assessment is recommended for women who have  family members with BRCA-related cancers. BRCA-related cancers include: ? Breast. ? Ovarian. ? Tubal. ? Peritoneal cancers.  Results of the assessment will determine the need for genetic counseling and BRCA1 and BRCA2 testing.  Cervical Cancer Your health care provider may recommend that you be screened regularly for cancer of the pelvic organs (ovaries, uterus, and vagina). This screening involves a pelvic examination, including checking for microscopic changes to the surface of your cervix (Pap test). You may be encouraged to have this screening done every 3 years, beginning at age 20  For women ages 12-65, health care providers may recommend pelvic exams and Pap testing every 3 years, or they may recommend the Pap and pelvic exam, combined with testing for human papilloma virus (HPV), every 5 years. Some types of HPV increase your risk of cervical cancer. Testing for HPV may also be done on women of any age with unclear Pap test results.  Other health care providers may not recommend any screening for nonpregnant women who are considered low risk for pelvic cancer and who do not have symptoms. Ask your health care provider if a screening pelvic exam is right for you.  If you have had past treatment for cervical cancer or a condition that could lead to cancer, you need Pap tests and screening for cancer for at least 20 years after your treatment. If Pap tests have been discontinued, your risk factors (such as having a new sexual partner) need to be reassessed to determine if screening should resume. Some women have medical problems that increase the chance of getting cervical cancer. In these cases, your health care provider may recommend more frequent screening and Pap tests.  Colorectal Cancer  This type of cancer can be detected and often prevented.  Routine colorectal cancer screening usually begins at 20 years of age and continues through 20 years of age.  Your health care provider may  recommend screening at an earlier age if you have risk factors for colon cancer.  Your health care provider may also recommend using home test kits to check for hidden blood in the stool.  A small camera at the end of a tube can be used to examine your colon directly (sigmoidoscopy or colonoscopy). This is done to check for the earliest forms of colorectal cancer.  Routine screening usually begins at age 63.  Direct examination of the colon should be repeated every 5-10 years through 20 years of age. However, you may need to be screened more often if early forms of precancerous polyps or small growths are found.  Skin Cancer  Check your skin from head to toe regularly.  Tell your health care provider about any new moles or changes in moles, especially if there is a change in a mole's shape or color.  Also tell your health care provider if you have a mole that is larger than the size of a pencil eraser.  Always use sunscreen. Apply sunscreen liberally and repeatedly  throughout the day.  Protect yourself by wearing long sleeves, pants, a wide-brimmed hat, and sunglasses whenever you are outside.  Heart disease, diabetes, and high blood pressure  High blood pressure causes heart disease and increases the risk of stroke. High blood pressure is more likely to develop in: ? People who have blood pressure in the high end of the normal range (130-139/85-89 mm Hg). ? People who are overweight or obese. ? People who are African American.  If you are 18-32 years of age, have your blood pressure checked every 3-5 years. If you are 27 years of age or older, have your blood pressure checked every year. You should have your blood pressure measured twice-once when you are at a hospital or clinic, and once when you are not at a hospital or clinic. Record the average of the two measurements. To check your blood pressure when you are not at a hospital or clinic, you can use: ? An automated blood pressure  machine at a pharmacy. ? A home blood pressure monitor.  If you are between 32 years and 48 years old, ask your health care provider if you should take aspirin to prevent strokes.  Have regular diabetes screenings. This involves taking a blood sample to check your fasting blood sugar level. ? If you are at a normal weight and have a low risk for diabetes, have this test once every three years after 20 years of age. ? If you are overweight and have a high risk for diabetes, consider being tested at a younger age or more often. Preventing infection Hepatitis B  If you have a higher risk for hepatitis B, you should be screened for this virus. You are considered at high risk for hepatitis B if: ? You were born in a country where hepatitis B is common. Ask your health care provider which countries are considered high risk. ? Your parents were born in a high-risk country, and you have not been immunized against hepatitis B (hepatitis B vaccine). ? You have HIV or AIDS. ? You use needles to inject street drugs. ? You live with someone who has hepatitis B. ? You have had sex with someone who has hepatitis B. ? You get hemodialysis treatment. ? You take certain medicines for conditions, including cancer, organ transplantation, and autoimmune conditions.  Hepatitis C  Blood testing is recommended for: ? Everyone born from [birth year] through 1963. ? Anyone with known risk factors for hepatitis C.  Sexually transmitted infections (STIs)  You should be screened for sexually transmitted infections (STIs) including gonorrhea and chlamydia if: ? You are sexually active and are younger than 20 years of age. ? You are older than 20 years of age and your health care provider tells you that you are at risk for this type of infection. ? Your sexual activity has changed since you were last screened and you are at an increased risk for chlamydia or gonorrhea. Ask your health care provider if you are at  risk.  If you do not have HIV, but are at risk, it may be recommended that you take a prescription medicine daily to prevent HIV infection. This is called pre-exposure prophylaxis (PrEP). You are considered at risk if: ? You are sexually active and do not regularly use condoms or know the HIV status of your partner(s). ? You take drugs by injection. ? You are sexually active with a partner who has HIV.  Talk with your health care provider about whether you are  at high risk of being infected with HIV. If you choose to begin PrEP, you should first be tested for HIV. You should then be tested every 3 months for as long as you are taking PrEP. Pregnancy  If you are premenopausal and you may become pregnant, ask your health care provider about preconception counseling.  If you may become pregnant, take 400 to 800 micrograms (mcg) of folic acid every day.  If you want to prevent pregnancy, talk to your health care provider about birth control (contraception). Osteoporosis and menopause  Osteoporosis is a disease in which the bones lose minerals and strength with aging. This can result in serious bone fractures. Your risk for osteoporosis can be identified using a bone density scan.  If you are 61 years of age or older, or if you are at risk for osteoporosis and fractures, ask your health care provider if you should be screened.  Ask your health care provider whether you should take a calcium or vitamin D supplement to lower your risk for osteoporosis.  Menopause may have certain physical symptoms and risks.  Hormone replacement therapy may reduce some of these symptoms and risks. Talk to your health care provider about whether hormone replacement therapy is right for you. Follow these instructions at home:  Schedule regular health, dental, and eye exams.  Stay current with your immunizations.  Do not use any tobacco products including cigarettes, chewing tobacco, or electronic  cigarettes.  If you are pregnant, do not drink alcohol.  If you are breastfeeding, limit how much and how often you drink alcohol.  Limit alcohol intake to no more than 1 drink per day for nonpregnant women. One drink equals 12 ounces of beer, 5 ounces of wine, or 1 ounces of hard liquor.  Do not use street drugs.  Do not share needles.  Ask your health care provider for help if you need support or information about quitting drugs.  Tell your health care provider if you often feel depressed.  Tell your health care provider if you have ever been abused or do not feel safe at home. This information is not intended to replace advice given to you by your health care provider. Make sure you discuss any questions you have with your health care provider. Document Released: 04/08/2011 Document Revised: 02/29/2016 Document Reviewed: 06/27/2015 Elsevier Interactive Patient Education  [employer].

## 2018-05-18 NOTE — Assessment & Plan Note (Signed)
tsh low - dec dose to 125 mcg daily Recheck tfts in 6-8 weeks

## 2018-05-18 NOTE — Assessment & Plan Note (Signed)
Controlled with as needed elocon cream

## 2018-05-18 NOTE — Assessment & Plan Note (Addendum)
Left breast lump - 2 o'clock, mobile Tender at times - esp around menses Likely cyst Will order an [place]

## 2018-07-21 ENCOUNTER — Telehealth: Payer: Self-pay | Admitting: Internal Medicine

## 2018-07-21 DIAGNOSIS — E063 Autoimmune thyroiditis: Secondary | ICD-10-CM

## 2018-07-21 DIAGNOSIS — Z111 Encounter for screening for respiratory tuberculosis: Secondary | ICD-10-CM

## 2018-07-21 NOTE — Telephone Encounter (Addendum)
Copied from CRM [id number]. Topic: General - Other >> Jul 21, 2018  1:46 PM Leafy Ro wrote: Reason for CRM: pt would like written  order to have tsh , t3 and t4 and tb blood test quantiferon. Pt has an appt for flu shot on 07/23/18 at 9 am. Pt would like to have pick up order then

## 2018-07-23 ENCOUNTER — Ambulatory Visit (INDEPENDENT_AMBULATORY_CARE_PROVIDER_SITE_OTHER): Payer: 59

## 2018-07-23 ENCOUNTER — Other Ambulatory Visit: Payer: Self-pay | Admitting: Internal Medicine

## 2018-07-23 ENCOUNTER — Ambulatory Visit
Admission: RE | Admit: 2018-07-23 | Discharge: 2018-07-23 | Disposition: A | Discharge status: Home / Self Care | Payer: 59 | Source: Ambulatory Visit | Attending: Internal Medicine | Admitting: Internal Medicine

## 2018-07-23 DIAGNOSIS — N6012 Diffuse cystic mastopathy of left breast: Secondary | ICD-10-CM | POA: Diagnosis not present

## 2018-07-23 DIAGNOSIS — Z23 Encounter for immunization: Secondary | ICD-10-CM

## 2018-07-23 DIAGNOSIS — E063 Autoimmune thyroiditis: Secondary | ICD-10-CM | POA: Diagnosis not present

## 2018-07-23 DIAGNOSIS — Z111 Encounter for screening for respiratory tuberculosis: Secondary | ICD-10-CM | POA: Diagnosis not present

## 2018-07-23 DIAGNOSIS — N6320 Unspecified lump in the left breast, unspecified quadrant: Secondary | ICD-10-CM

## 2018-07-23 NOTE — Telephone Encounter (Signed)
Done and given to Tamara 

## 2018-07-26 LAB — QUANTIFERON-TB GOLD PLUS
QUANTIFERON TB1 AG VALUE: 0.04 [IU]/mL
QUANTIFERON-TB GOLD PLUS: NEGATIVE
QuantiFERON Nil Value: 0.03 IU/mL
QuantiFERON TB2 Ag Value: 0.04 IU/mL

## 2018-07-26 LAB — T3, FREE: T3, Free: 3.3 pg/mL (ref 2.0–4.4)

## 2018-07-26 LAB — TSH: TSH: 0.3 u[IU]/mL — AB (ref 0.450–4.500)

## 2018-07-26 LAB — T4, FREE: FREE T4: 1.46 ng/dL (ref 0.82–1.77)

## 2018-07-28 MED ORDER — LEVOTHYROXINE SODIUM 112 MCG PO TABS: 112.0000 ug | ORAL_TABLET | Freq: Every day | ORAL | 0 refills | Status: DC

## 2018-07-28 NOTE — Addendum Note (Signed)
Addended by: Mercer Pod E on: 07/28/2018 04:33 PM   Modules accepted: Orders

## 2018-08-09 ENCOUNTER — Telehealth: Payer: Self-pay | Admitting: Internal Medicine

## 2018-08-17 DIAGNOSIS — B379 Candidiasis, unspecified: Secondary | ICD-10-CM | POA: Diagnosis not present

## 2018-08-17 DIAGNOSIS — N898 Other specified noninflammatory disorders of vagina: Secondary | ICD-10-CM | POA: Diagnosis not present

## 2018-08-26 ENCOUNTER — Other Ambulatory Visit: Payer: Self-pay | Admitting: Internal Medicine

## 2018-09-08 ENCOUNTER — Other Ambulatory Visit: Payer: Self-pay | Admitting: Internal Medicine

## 2018-09-09 ENCOUNTER — Other Ambulatory Visit: Payer: Self-pay

## 2018-09-09 MED ORDER — NORGESTIMATE-ETH ESTRADIOL 0.25-35 MG-MCG PO TABS: 1.0000 | ORAL_TABLET | Freq: Every day | ORAL | 3 refills | Status: AC

## 2018-09-09 NOTE — Telephone Encounter (Signed)
We would have to check her tsh to see if her thyroid dose is off.   I printed the rx for the birth control - she can start it right away or wait - either is ok, but if she has missed a few days and is having sex she needs to use double protection.

## 2018-09-09 NOTE — Telephone Encounter (Signed)
Pt checking to see why rx was denied refill. Also pt is wanting to know that since she was unable to take her birth control at the start time for this month does she need to wait until next month to start the new pack once refilled or when she should start taking them? Also pt states since starting the levothyroxine (SYNTHROID, LEVOTHROID) 112 MCG tablet She has gained a noticeable amount of weight and is wondering if the dosage is to low? Pelase advise.

## 2018-09-10 NOTE — Telephone Encounter (Signed)
Notified pt wMD response. Pt states she will have mom pick up rx since she is in [place] in school. Will be home in a week will come to have labs drawn then...Raechel Chute/lmb

## 2018-09-11 ENCOUNTER — Other Ambulatory Visit: Payer: Self-pay

## 2018-09-11 DIAGNOSIS — E063 Autoimmune thyroiditis: Secondary | ICD-10-CM

## 2018-09-21 ENCOUNTER — Other Ambulatory Visit: Payer: Self-pay | Admitting: *Deleted

## 2018-09-21 MED ORDER — ESCITALOPRAM OXALATE 10 MG PO TABS: 10.0000 mg | ORAL_TABLET | Freq: Every day | ORAL | 0 refills | Status: DC

## 2018-09-21 NOTE — Telephone Encounter (Signed)
[facility] stated [facility] never received the patient's escitalopram (LEXAPRO) 10 MG tablet medication on 08/11/18 as Ramer told them.  [facility].  Nothing is in the computer on it.  So they now need it refilled [facility] [address] [place]. [phone] because the patient is in school there.

## 2018-09-21 NOTE — Telephone Encounter (Signed)
Call to pharmacy- they never received Rx- [facility] [place] [facility].

## 2018-09-25 ENCOUNTER — Other Ambulatory Visit: Payer: Self-pay | Admitting: Internal Medicine

## 2018-09-26 LAB — T3, FREE: T3, Free: 3.3 pg/mL (ref 2.0–4.4)

## 2018-09-26 LAB — TSH: TSH: 0.257 u[IU]/mL — ABNORMAL LOW (ref 0.450–4.500)

## 2018-09-26 LAB — T4, FREE: Free T4: 1.39 ng/dL (ref 0.82–1.77)

## 2018-09-28 ENCOUNTER — Other Ambulatory Visit: Payer: Self-pay | Admitting: Family

## 2018-09-28 MED ORDER — LEVOTHYROXINE SODIUM 100 MCG PO TABS: 100.0000 ug | ORAL_TABLET | Freq: Every day | ORAL | 0 refills | Status: DC

## 2018-10-05 ENCOUNTER — Other Ambulatory Visit: Payer: Self-pay | Admitting: Internal Medicine

## 2018-10-05 DIAGNOSIS — E039 Hypothyroidism, unspecified: Secondary | ICD-10-CM

## 2018-10-08 ENCOUNTER — Ambulatory Visit: Payer: 59 | Admitting: Internal Medicine

## 2018-10-31 ENCOUNTER — Other Ambulatory Visit: Payer: Self-pay | Admitting: Internal Medicine

## 2018-11-03 ENCOUNTER — Encounter: Payer: Self-pay | Admitting: Internal Medicine

## 2018-12-09 IMAGING — US ULTRASOUND LEFT BREAST LIMITED
1 series · 6 of 6 positions shown · non-contrast
Comparison: None.

CLINICAL DATA: 22-year-old female with a left breast palpable
abnormality. This has been present for at least 3 years intense to
fluctuate with her menstrual cycle. The patient states this mass
feels as if it has been stable in size.

EXAM:
ULTRASOUND OF THE LEFT BREAST

[Series 1: ultrasound left breast limited · 0.07mm/px · 6 of 6 slices shown]
[im 1/6]
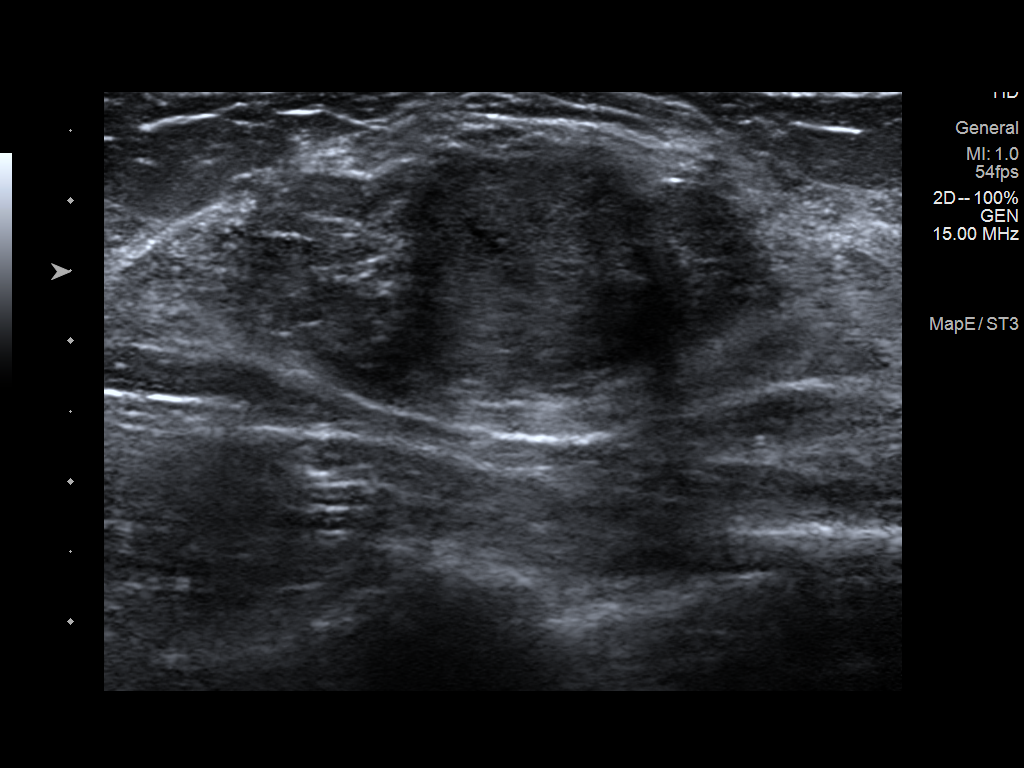
[im 2/6]
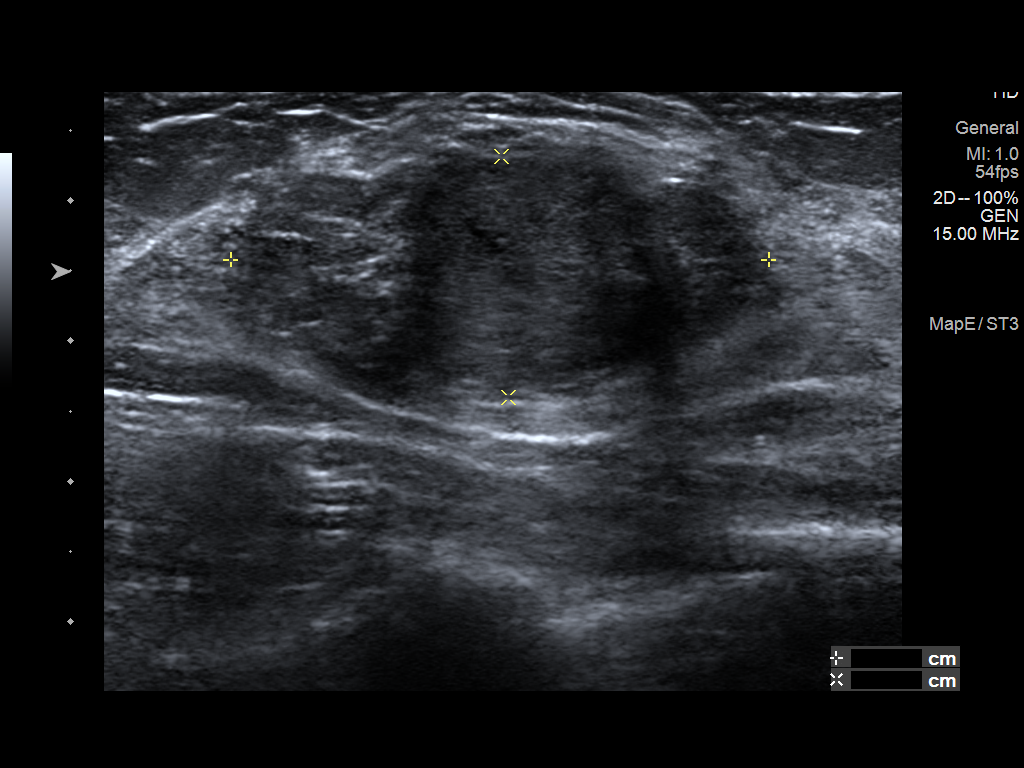
[im 3/6]
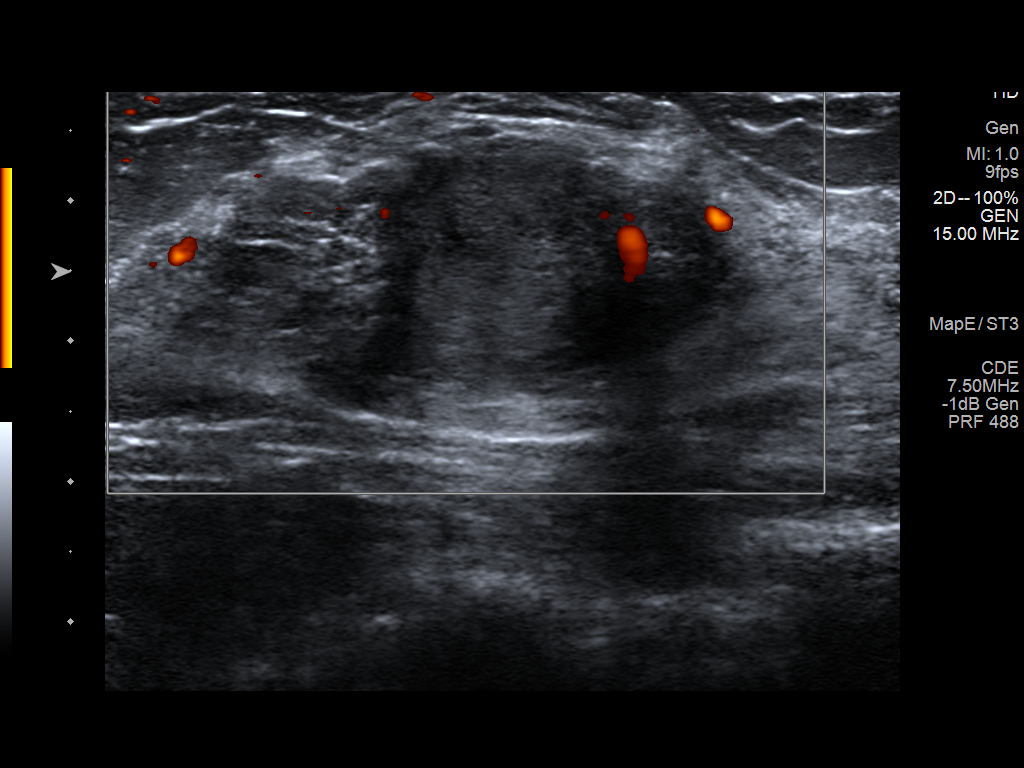
[im 4/6]
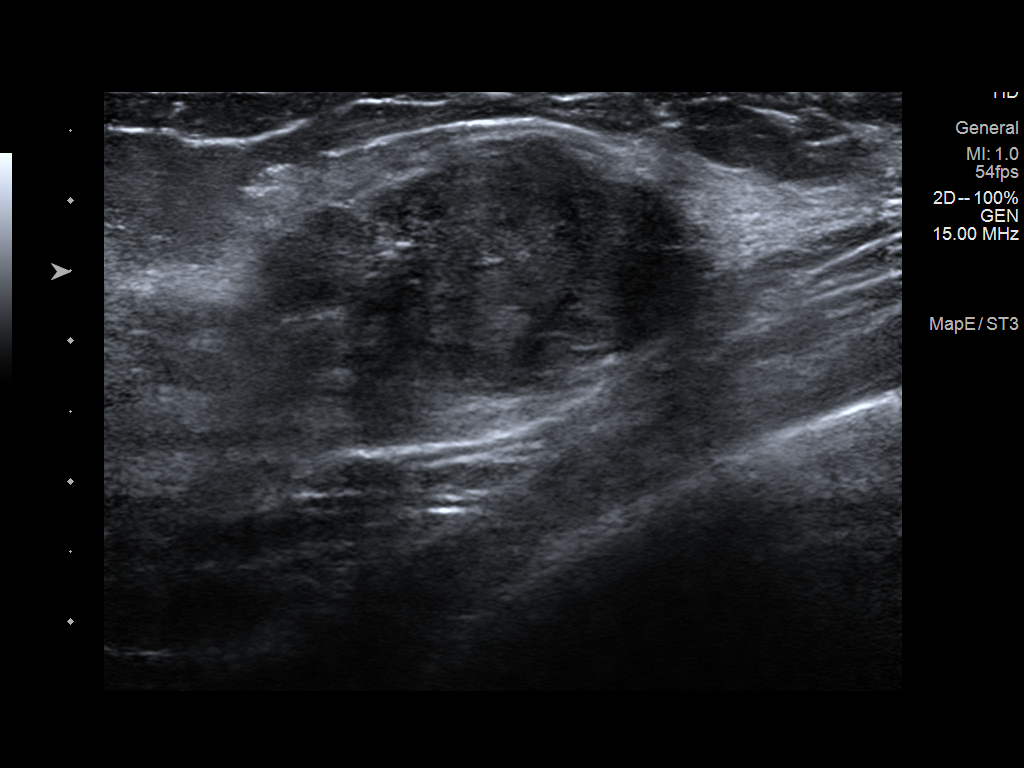
[im 5/6]
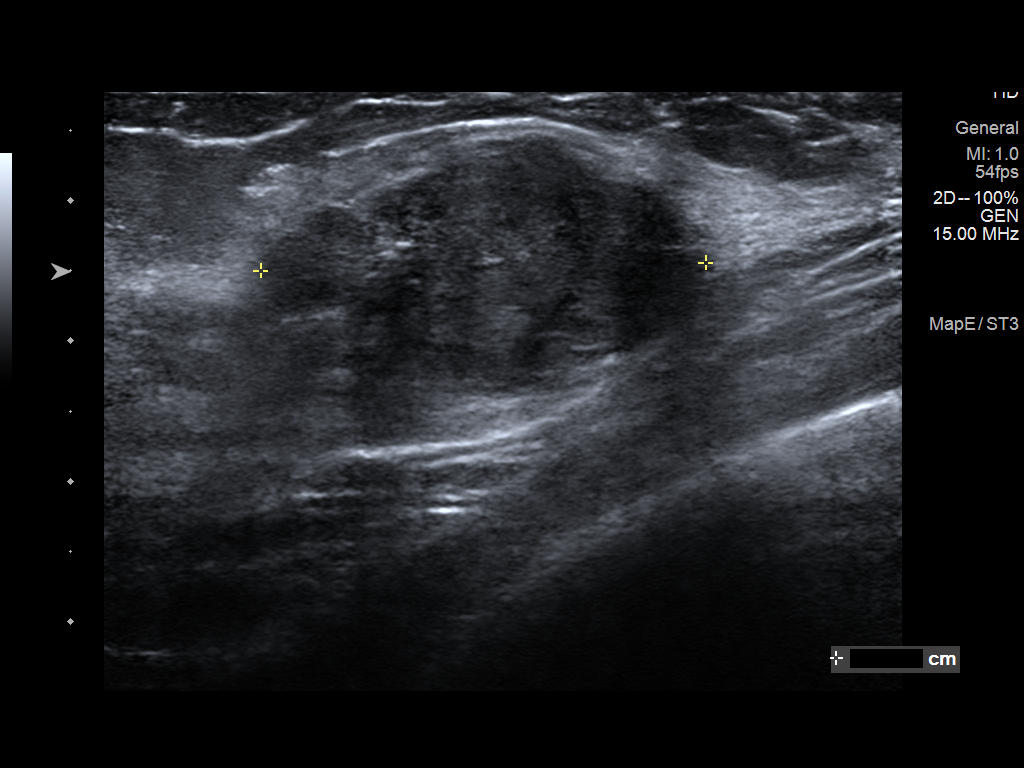
[im 6/6]
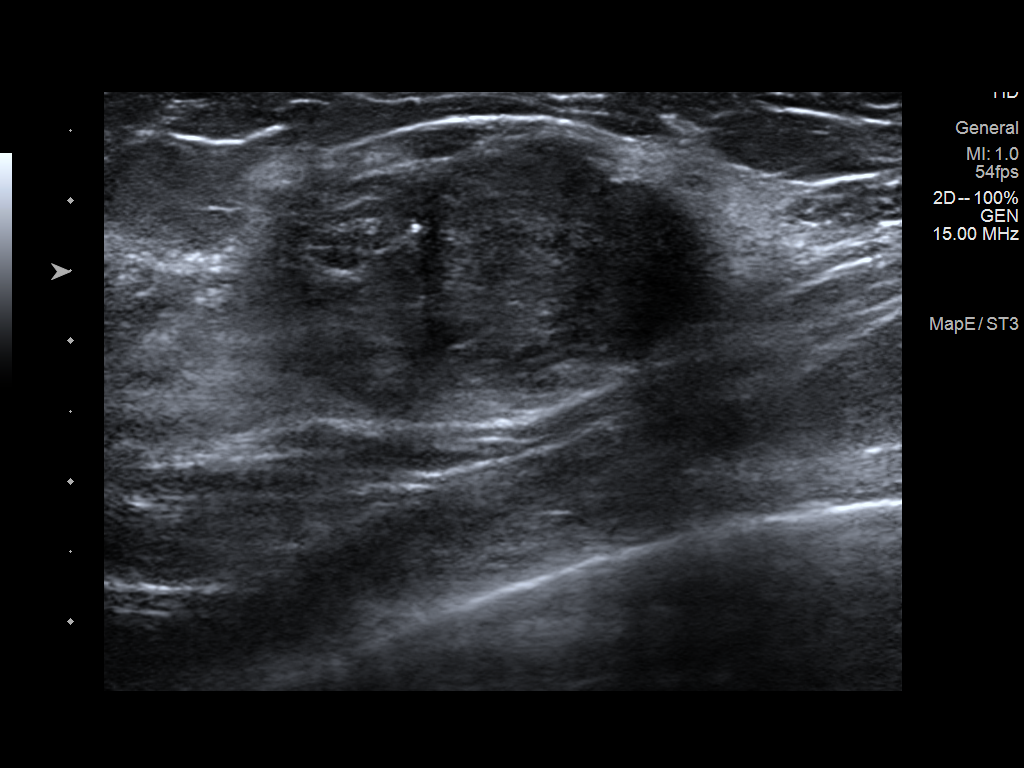

[6 of 6 positions shown; findings below may reference images not displayed]

FINDINGS: Physical examination in the region of palpable concern reveals a
firm mobile mass at the approximate 2 o'clock position.

Targeted ultrasound of the left breast was performed demonstrating
an oval circumscribed gently lobulated hypoechoic mass measuring
x 1.7 x 3.2 cm. There are few small cystic spaces as well as small
areas of echogenicity likely represent interspersed fat with
features most suggestive of a benign fibroadenoma.
IMPRESSION: Probably benign left breast mass.

RECOMMENDATION:
Left breast ultrasound in 6 months.

I have discussed the findings and recommendations with the patient.
Results were also provided in writing at the conclusion of the
visit. If applicable, a reminder letter will be sent to the patient
regarding the next appointment.

BI-RADS CATEGORY  3: Probably benign.

## 2018-12-13 ENCOUNTER — Other Ambulatory Visit: Payer: Self-pay | Admitting: Internal Medicine

## 2018-12-22 ENCOUNTER — Other Ambulatory Visit: Payer: Self-pay | Admitting: Family

## 2018-12-24 ENCOUNTER — Telehealth: Payer: Self-pay

## 2018-12-24 DIAGNOSIS — E063 Autoimmune thyroiditis: Secondary | ICD-10-CM

## 2018-12-24 NOTE — Telephone Encounter (Signed)
Copied from CRM [id number]. Topic: General - Inquiry >> Dec 23, 2018  4:06 PM Wyonia Hough E wrote: Reason for CRM: Pt needs Ladona Ridgel to call her about putting in an order for labs so the Pt can have her labs drawn off site. She needs to pick up the orders asap so she can get her refills that will be based off these results/ please advise

## 2018-12-24 NOTE — Telephone Encounter (Signed)
LVM letting pt know that lab orders printed and up front ready for pick up.

## 2018-12-25 ENCOUNTER — Other Ambulatory Visit: Payer: Self-pay | Admitting: Internal Medicine

## 2018-12-26 LAB — T3, FREE: T3, Free: 2.7 pg/mL (ref 2.0–4.4)

## 2018-12-26 LAB — T4, FREE: FREE T4: 1.3 ng/dL (ref 0.82–1.77)

## 2018-12-26 LAB — TSH: TSH: 7.2 u[IU]/mL — ABNORMAL HIGH (ref 0.450–4.500)

## 2018-12-30 ENCOUNTER — Telehealth: Payer: Self-pay | Admitting: Internal Medicine

## 2018-12-30 NOTE — Telephone Encounter (Signed)
Attempted to return call to pt.  Left voice message to call back to office to receive lab results.

## 2018-12-30 NOTE — Telephone Encounter (Signed)
Copied from CRM [id number]. Topic: Quick Communication - Lab Results (Clinic Use ONLY) >> Dec 30, 2018 10:01 AM Barbette Reichmann, CMA wrote:  Call back # [phone]  Called patient to inform them of 12/25/18 lab results. When patient returns call, triage nurse may disclose results.

## 2018-12-31 ENCOUNTER — Other Ambulatory Visit: Payer: Self-pay

## 2018-12-31 MED ORDER — LEVOTHYROXINE SODIUM 100 MCG PO TABS: ORAL_TABLET | ORAL | 0 refills | Status: DC

## 2019-02-12 ENCOUNTER — Other Ambulatory Visit: Payer: Self-pay

## 2019-02-12 ENCOUNTER — Other Ambulatory Visit: Payer: Self-pay | Admitting: Internal Medicine

## 2019-02-12 ENCOUNTER — Ambulatory Visit
Admission: RE | Admit: 2019-02-12 | Discharge: 2019-02-12 | Disposition: A | Discharge status: Home / Self Care | Payer: 59 | Source: Ambulatory Visit | Attending: Internal Medicine | Admitting: Internal Medicine

## 2019-02-12 DIAGNOSIS — N6320 Unspecified lump in the left breast, unspecified quadrant: Secondary | ICD-10-CM

## 2019-03-10 ENCOUNTER — Other Ambulatory Visit: Payer: Self-pay | Admitting: Internal Medicine

## 2019-03-11 NOTE — Progress Notes (Signed)
Subjective:    Patient ID: Melanie Scott, female    DOB: 06-Jan-1996, 23 y.o.   MRN: [id number]  HPI The patient is here for an acute visit.   Continued yeast infections:  For the past couple of months she has had a few yeast infections.  She went to the health center and she was prescribed fluconazole and she took it - it helped and then had to repeat in 3 weeks later.  A couple of weeks ago she had symptoms again and she took the 3 day monistat and it helped while she was on it only.  One week ago she still had symptoms and went back to urgent care  - diflucan x 1 - it helped for the first 24 hours, but the symptoms recurred.  She did have a wet prep at that time that she did herself and no yeast was seen.  She was advised that may be the result of already being on treatment.     She was having severe itching and it was very irritated.  She has itching on the inside of the vagina, the vulva and perinem region.   No abnormal vaginal discharge.  Her vulvar area feels a little swollen.  She had dysuria, but only once after scratching a lot and she thinks she irritated because the dysuria resolved.  She is not establish with a gynecologist.  She is not sexually active.  She does not do any douching.  She did switch her soap to something for sensitive skin.  Hypothyroidism: She is due to have her blood work rechecked.  Overall she feels good, except for some depression.  She is taking her medication daily as prescribed.  Depression, anxiety: She wondered if she should change her Lexapro dose.  She does have some depression, but feels her anxiety is better overall.     Medications and allergies reviewed with patient and updated if appropriate.  Patient Active Problem List   Diagnosis Date Noted  . Left breast lump 05/18/2018  . Adjustment disorder with mixed anxiety and depressed mood 05/15/2017  . Elevated cholesterol 05/23/2016  . Multiple thyroid nodules 12/13/2015  . Eczema 12/06/2015  .  Seasonal allergic rhinitis 07/06/2014  . Autoimmune hypothyroidism     Current Outpatient Medications on File Prior to Visit  Medication Sig Dispense Refill  . cetirizine (ZYRTEC) 10 MG tablet Take 1 tablet (10 mg total) by mouth daily. 90 tablet 3  . escitalopram (LEXAPRO) 10 MG tablet TAKE 1 TABLET (10 MG TOTAL) BY MOUTH DAILY. NEEDS OFFICE VISIT FOR MORE REFILLS. 90 tablet 0  . levothyroxine (SYNTHROID, LEVOTHROID) 100 MCG tablet Take 1 tablet by mouth 6 days a week and 1 and 1/2 tablet by mouth one day a week. 96 tablet 0  . mometasone (ELOCON) 0.1 % cream Apply 1 application topically daily. 45 g 3  . norgestimate-ethinyl estradiol (SPRINTEC 28) 0.25-35 MG-MCG tablet Take 1 tablet by mouth daily. 3 Package 3   No current facility-administered medications on file prior to visit.     Past Medical History:  Diagnosis Date  . Autoimmune hypothyroidism    s/p R thyridectomy 2011 (benign nodules)  . Chicken pox     Past Surgical History:  Procedure Laterality Date  . left lobe thyroidectomy  03/2016  . Right Lobe Thyroidectomy  2011   benign nodules on thyroid    Social History   Socioeconomic History  . Marital status: Single    Spouse name: Not on  file  . Number of children: Not on file  . Years of education: 11 years  . Highest education level: Not on file  Occupational History  . 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 Financial resource strain: Not on file  . Food insecurity:    Worry: Not on file    Inability: Not on file  . Transportation needs:    Medical: Not on file    Non-medical: Not on file  Tobacco Use  . Smoking status: Never Smoker  . Smokeless tobacco: Never Used  Substance and Sexual Activity  . Alcohol use: No  . Drug use: No  . Sexual activity: Not on file  Lifestyle  . Physical activity:    Days per week: Not on file    Minutes per session: Not on file  . Stress: Not on file  Relationships  . Social connections:    Talks on  phone: Not on file    Gets together: Not on file    Attends religious service: Not on file    Active member of club or organization: Not on file    Attends meetings of clubs or organizations: Not on file    Relationship status: Not on file  Other Topics Concern  . Not on file  Social History Narrative   Lives with mom, dad and sister   [place]      Regular exercise    Family History  Problem Relation Age of Onset  . Hyperlipidemia Other   . Hypertension Other   . Cancer Other        Breast Cancer    Review of Systems  Constitutional: Negative for chills and fever.  Gastrointestinal: Negative for abdominal pain and nausea.  Genitourinary: Negative for dysuria, frequency, hematuria, menstrual problem (menses normal) and vaginal discharge.       Objective:   Vitals:   03/12/19 1118  BP: 114/72  Pulse: 96  Resp: 16  Temp: 98.3 F (36.8 C)  SpO2: 97%   BP Readings from Last 3 Encounters:  03/12/19 114/72  05/18/18 122/80  07/24/17 120/78   Wt Readings from Last 3 Encounters:  03/12/19 154 lb (69.9 kg)  05/18/18 143 lb (64.9 kg)  07/24/17 147 lb (66.7 kg) (78 %, Z= 0.77)*   * Growth percentiles are based on CDC (Girls, 2-20 Years) data.   Body mass index is 28.17 kg/m.   Physical Exam Constitutional:      General: She is not in acute distress.    Appearance: Normal appearance. She is not ill-appearing.  Genitourinary:    Vagina: No vaginal discharge.     Comments: Vulvar region slightly swollen and erythematous Neurological:     Mental Status: She is alert.  Psychiatric:        Mood and Affect: Mood normal.        Behavior: Behavior normal.            Assessment & Plan:    See Problem List for Assessment and Plan of chronic medical problems.

## 2019-03-12 ENCOUNTER — Other Ambulatory Visit: Payer: Self-pay | Admitting: Internal Medicine

## 2019-03-12 ENCOUNTER — Encounter: Payer: Self-pay | Admitting: Internal Medicine

## 2019-03-12 ENCOUNTER — Other Ambulatory Visit: Payer: Self-pay

## 2019-03-12 ENCOUNTER — Ambulatory Visit (INDEPENDENT_AMBULATORY_CARE_PROVIDER_SITE_OTHER): Payer: 59 | Admitting: Internal Medicine

## 2019-03-12 VITALS — BP 114/72 | HR 96 | Temp 98.3°F | Resp 16 | Ht 62.0 in | Wt 154.0 lb

## 2019-03-12 DIAGNOSIS — E063 Autoimmune thyroiditis: Secondary | ICD-10-CM

## 2019-03-12 DIAGNOSIS — B373 Candidiasis of vulva and vagina: Secondary | ICD-10-CM

## 2019-03-12 DIAGNOSIS — F4323 Adjustment disorder with mixed anxiety and depressed mood: Secondary | ICD-10-CM | POA: Diagnosis not present

## 2019-03-12 DIAGNOSIS — B3731 Acute candidiasis of vulva and vagina: Secondary | ICD-10-CM

## 2019-03-12 MED ORDER — FLUCONAZOLE 150 MG PO TABS: ORAL_TABLET | ORAL | 0 refills | Status: DC

## 2019-03-12 NOTE — Assessment & Plan Note (Signed)
Has had 3 infections in the past several weeks and symptoms recur ?  Incompletely treated yeast infections or undiagnosed skin issues Discussed that she may need to see gynecology if her symptoms do not resolve We will treat with Diflucan since this has helped-1 dose every 72 hours x3 doses and then once a week for 6 weeks If her symptoms persist or recur after the first 3 doses then she will establish with a gynecologist-possible lichen sclerosis

## 2019-03-12 NOTE — Assessment & Plan Note (Signed)
Due for repeat blood work-ordered We will adjust medication dose if needed

## 2019-03-12 NOTE — Assessment & Plan Note (Signed)
Discussed options Anxiety seems to be controlled, but she is feeling depressed She would like to hold off on changing her medication at this time We will discuss again in August at the time of her physical

## 2019-03-12 NOTE — Patient Instructions (Addendum)
Have your thyroid recheck - lab order printed.     Medications reviewed and updated.  Changes include :    Diflucan 150 mg Q 72 hrs x 3 doses and then once a week x 6 weeks  Your prescription has been given to you. Please take as directed and contact our office if you believe you are having problem(s) with the medication(s).

## 2019-03-13 LAB — TSH: TSH: 5.16 u[IU]/mL — ABNORMAL HIGH (ref 0.450–4.500)

## 2019-03-13 LAB — T4, FREE: Free T4: 1.3 ng/dL (ref 0.82–1.77)

## 2019-03-13 LAB — T3, FREE: T3, Free: 2.8 pg/mL (ref 2.0–4.4)

## 2019-03-15 ENCOUNTER — Telehealth: Payer: Self-pay | Admitting: Internal Medicine

## 2019-03-15 NOTE — Telephone Encounter (Signed)
Thyroid function still slightly low  - increase dose to 1.5 pills twice a week.  Can recheck tsh in 3-4 months - when she is back in the area.   Please update med list

## 2019-03-16 MED ORDER — LEVOTHYROXINE SODIUM 100 MCG PO TABS: ORAL_TABLET | ORAL | 0 refills | Status: DC

## 2019-03-16 NOTE — Telephone Encounter (Signed)
Pt aware of results. Results and new rx put up front for patient to pick up per her request.

## 2019-04-12 ENCOUNTER — Telehealth: Payer: Self-pay | Admitting: Emergency Medicine

## 2019-04-12 NOTE — Telephone Encounter (Signed)
Pt called to inform that she is returning to school before 8/10. Advised that she had an appt in June and could wait until she gets out for the semester to do her physical.   Pt also advised she will be scheduling an appt with GYN because she feels that the diflucan only worked while taking.

## 2019-04-21 ENCOUNTER — Telehealth: Payer: Self-pay | Admitting: Internal Medicine

## 2019-04-21 NOTE — Telephone Encounter (Signed)
Patient states she goes to school out of town.  Patient was requesting a CPE the week of 8/ 3 when Quay Burow will be out of town.  She would like to know when she needs to be seen again.  Because the next time she will be able to come back to town will be at the end of her semester this year.

## 2019-04-21 NOTE — Telephone Encounter (Signed)
Per Dr Quay Burow, okay to wait until pt goes on break from school to schedule CPE.

## 2019-05-20 ENCOUNTER — Encounter: Payer: 59 | Admitting: Internal Medicine

## 2019-06-03 ENCOUNTER — Other Ambulatory Visit: Payer: Self-pay | Admitting: Internal Medicine

## 2019-06-03 ENCOUNTER — Encounter: Payer: Self-pay | Admitting: Internal Medicine

## 2019-06-07 ENCOUNTER — Other Ambulatory Visit: Payer: Self-pay | Admitting: Internal Medicine

## 2019-06-16 ENCOUNTER — Encounter: Payer: Self-pay | Admitting: Internal Medicine

## 2019-06-17 ENCOUNTER — Other Ambulatory Visit: Payer: Self-pay | Admitting: Internal Medicine

## 2019-06-17 ENCOUNTER — Other Ambulatory Visit: Payer: Self-pay

## 2019-06-17 DIAGNOSIS — E063 Autoimmune thyroiditis: Secondary | ICD-10-CM

## 2019-06-21 ENCOUNTER — Other Ambulatory Visit: Payer: Self-pay | Admitting: Internal Medicine

## 2019-06-21 NOTE — Telephone Encounter (Signed)
Copied from [place] [id number]. Topic: General - Inquiry >> Jun 21, 2019  9:08 AM Virl Axe D wrote: Reason for CRM: Pt stated she did not receive email. Advised it was sent on 9/11 per telephone note. Pt would like to know if it can be sent again. [email or web address] alternative [email or web address]  Pt stated it's very urgent she receive today.

## 2019-06-22 ENCOUNTER — Encounter: Payer: Self-pay | Admitting: Internal Medicine

## 2019-06-22 ENCOUNTER — Other Ambulatory Visit: Payer: Self-pay | Admitting: Internal Medicine

## 2019-06-22 LAB — TSH: TSH: 2.94 u[IU]/mL (ref 0.450–4.500)

## 2019-06-22 LAB — T3, FREE: T3, Free: 2.9 pg/mL (ref 2.0–4.4)

## 2019-06-22 LAB — T4, FREE: Free T4: 1.31 ng/dL (ref 0.82–1.77)

## 2019-06-23 MED ORDER — LEVOTHYROXINE SODIUM 100 MCG PO TABS: ORAL_TABLET | ORAL | 1 refills | Status: DC

## 2019-07-09 ENCOUNTER — Other Ambulatory Visit: Payer: Self-pay | Admitting: Internal Medicine

## 2019-07-13 ENCOUNTER — Other Ambulatory Visit: Payer: Self-pay | Admitting: Internal Medicine

## 2019-07-14 ENCOUNTER — Other Ambulatory Visit: Payer: Self-pay | Admitting: Internal Medicine

## 2019-07-15 ENCOUNTER — Other Ambulatory Visit: Payer: Self-pay | Admitting: Internal Medicine

## 2019-07-15 ENCOUNTER — Encounter: Payer: Self-pay | Admitting: Internal Medicine

## 2019-07-15 MED ORDER — ESCITALOPRAM OXALATE 10 MG PO TABS: 10.0000 mg | ORAL_TABLET | Freq: Every day | ORAL | 0 refills | Status: DC

## 2019-07-16 ENCOUNTER — Other Ambulatory Visit: Payer: Self-pay

## 2019-07-16 MED ORDER — ESCITALOPRAM OXALATE 10 MG PO TABS: 10.0000 mg | ORAL_TABLET | Freq: Every day | ORAL | 1 refills | Status: DC

## 2019-08-30 ENCOUNTER — Ambulatory Visit (INDEPENDENT_AMBULATORY_CARE_PROVIDER_SITE_OTHER): Payer: 59

## 2019-08-30 ENCOUNTER — Other Ambulatory Visit: Payer: Self-pay

## 2019-08-30 DIAGNOSIS — Z299 Encounter for prophylactic measures, unspecified: Secondary | ICD-10-CM

## 2019-08-30 DIAGNOSIS — Z23 Encounter for immunization: Secondary | ICD-10-CM | POA: Diagnosis not present

## 2019-08-30 NOTE — Progress Notes (Signed)
Subjective:    Patient ID: Melanie Scott, female    DOB: 1996/08/22, 23 y.o.   MRN: [id number]  HPI She is here for a physical exam.   She graduates from college spring 2021 and has applied to med school.  This has been a tough year with Covid.  She has had increased depression.  She is interested in talking to someone and would like some names of possible therapist.  She has been eating more and has not been exercising as regularly.  She has gained weight and she is concerned about that as well.  Medications and allergies reviewed with patient and updated if appropriate.  Patient Active Problem List   Diagnosis Date Noted  . Vulvar candidiasis 03/12/2019  . Left breast lump 05/18/2018  . Adjustment disorder with mixed anxiety and depressed mood 05/15/2017  . Elevated cholesterol 05/23/2016  . Multiple thyroid nodules 12/13/2015  . Eczema 12/06/2015  . Seasonal allergic rhinitis 07/06/2014  . Autoimmune hypothyroidism     Current Outpatient Medications on File Prior to Visit  Medication Sig Dispense Refill  . cetirizine (ZYRTEC) 10 MG tablet Take 1 tablet (10 mg total) by mouth daily. 90 tablet 3  . escitalopram (LEXAPRO) 10 MG tablet Take 1 tablet (10 mg total) by mouth daily. Annual appt is due must see provider for future refills 90 tablet 1  . levothyroxine (SYNTHROID) 100 MCG tablet TAKE 1 TABLET BY MOUTH 5 DAYS A WEEK & 1 & 1/2 TABS TWO DAYS A WEEK 102 tablet 1  . mometasone (ELOCON) 0.1 % cream Apply 1 application topically daily. 45 g 3  . norgestimate-ethinyl estradiol (SPRINTEC 28) 0.25-35 MG-MCG tablet Take 1 tablet by mouth daily. 3 Package 3  . triamcinolone ointment (KENALOG) 0.1 % APPLY A THIN LAYER TO THE AFFECTED AREA(S) TOPICALLY TWICE A DAY FOR 7 DAYS     No current facility-administered medications on file prior to visit.     Past Medical History:  Diagnosis Date  . Autoimmune hypothyroidism    s/p R thyridectomy 2011 (benign nodules)  . Chicken pox      Past Surgical History:  Procedure Laterality Date  . left lobe thyroidectomy  03/2016  . Right Lobe Thyroidectomy  2011   benign nodules on thyroid    Social History   Socioeconomic History  . Marital status: Single    Spouse name: Not on file  . Number of children: Not on file  . Years of education: 11 years  . Highest education level: Not on file  Occupational History  . Occupation: high Ecologist  Social Needs  . Financial resource strain: Not on file  . Food insecurity    Worry: Not on file    Inability: Not on file  . Transportation needs    Medical: Not on file    Non-medical: Not on file  Tobacco Use  . Smoking status: Never Smoker  . Smokeless tobacco: Never Used  Substance and Sexual Activity  . Alcohol use: No  . Drug use: No  . Sexual activity: Not on file  Lifestyle  . Physical activity    Days per week: Not on file    Minutes per session: Not on file  . Stress: Not on file  Relationships  . Social Musician on phone: Not on file    Gets together: Not on file    Attends religious service: Not on file    Active member of club or organization: Not  on file    Attends meetings of clubs or organizations: Not on file    Relationship status: Not on file  Other Topics Concern  . Not on file  Social History Narrative   Lives with mom, dad and sister   [place]      Regular exercise    Family History  Problem Relation Age of Onset  . Hyperlipidemia Other   . Hypertension Other   . Cancer Other        Breast Cancer    Review of Systems  Constitutional: Negative for chills and fever.  Eyes: Negative for visual disturbance.  Respiratory: Positive for cough (allergy related). Negative for shortness of breath and wheezing.   Cardiovascular: Negative for chest pain, palpitations and leg swelling.  Gastrointestinal: Negative for abdominal pain, blood in stool, constipation, diarrhea and nausea.       No gerd  Genitourinary:  Negative for dysuria and hematuria.  Musculoskeletal: Negative for arthralgias.  Skin: Positive for color change (area on left shoulder). Negative for rash.  Neurological: Negative for light-headedness and headaches.  Psychiatric/Behavioral: Positive for dysphoric mood. The patient is nervous/anxious.        Objective:   Vitals:   08/31/19 0916  BP: 128/70  Pulse: 77  Resp: 16  Temp: 98.5 F (36.9 C)  SpO2: 97%   Filed Weights   08/31/19 0916  Weight: 165 lb (74.8 kg)   Body mass index is 30.18 kg/m.  BP Readings from Last 3 Encounters:  08/31/19 128/70  03/12/19 114/72  05/18/18 122/80    Wt Readings from Last 3 Encounters:  08/31/19 165 lb (74.8 kg)  03/12/19 154 lb (69.9 kg)  05/18/18 143 lb (64.9 kg)     Physical Exam Constitutional: She appears well-developed and well-nourished. No distress.  HENT:  Head: Normocephalic and atraumatic.  Right Ear: External ear normal. Normal ear canal and TM Left Ear: External ear normal.  Normal ear canal and TM Mouth/Throat: Oropharynx is clear and moist.  Eyes: Conjunctivae and EOM are normal.  Neck: Neck supple. No tracheal deviation present. No thyromegaly present.  No carotid bruit  Cardiovascular: Normal rate, regular rhythm and normal heart sounds.   No murmur heard.  No edema. Pulmonary/Chest: Effort normal and breath sounds normal. No respiratory distress. She has no wheezes. She has no rales.  Breast: deferred   Abdominal: Soft. She exhibits no distension. There is no tenderness.  Lymphadenopathy: She has no cervical adenopathy.  Skin: Skin is warm and dry. She is not diaphoretic. Raised normal appearing mole on left shoulder Psychiatric: She has a normal mood and affect. Her behavior is normal.        Assessment & Plan:   Physical exam: Screening blood work    ordered Immunizations  Up to date Gyn   Up to date - August Exercise  Will start going to the gym Weight  Advised weight loss Substance abuse   None    See Problem List for Assessment and Plan of chronic medical problems.   FU in 6 months

## 2019-08-30 NOTE — Patient Instructions (Addendum)
Tests ordered today. Your results will be released to MyChart (or called to you) after review.  If any changes need to be made, you will be notified at that same time.  All other Health Maintenance issues reviewed.   All recommended immunizations and age-appropriate screenings are up-to-date or discussed.  No immunization administered today.   Medications reviewed and updated.  Changes include :   Increase lexapro to 20 mg.     Please followup in 6 months     [facility] [phone]  [facility] [address] [place] [facility] [phone]  [facility]  [address] [place], [place] [phone]  [facility], [facility] [address], [place], [place], [address] [phone] Office [phone] [facility]            [address] [place], [place] [phone]      Health Maintenance, Female Adopting a healthy lifestyle and getting preventive care are important in promoting health and wellness. Ask your health care provider about:  The right schedule for you to have regular tests and exams.  Things you can do on your own to prevent diseases and keep yourself healthy. What should I know about diet, weight, and exercise? Eat a healthy diet   Eat a diet that includes plenty of vegetables, fruits, low-fat dairy products, and lean protein.  Do not eat a lot of foods that are high in solid fats, added sugars, or sodium. Maintain a healthy weight Body mass index (BMI) is used to identify weight problems. It estimates body fat based on height and weight. Your health care provider can help determine your BMI and help you achieve or maintain a healthy weight. Get regular exercise Get regular exercise. This is one of the most important things you can do for your health. Most  adults should:  Exercise for at least 150 minutes each week. The exercise should increase your heart rate and make you sweat (moderate-intensity exercise).  Do strengthening exercises at least twice a week. This is in addition to the moderate-intensity exercise.  Spend less time sitting. Even light physical activity can be beneficial. Watch cholesterol and blood lipids Have your blood tested for lipids and cholesterol at 21 years of age, then have this test every 5 years. Have your cholesterol levels checked more often if:  Your lipid or cholesterol levels are high.  You are older than 21 years of age.  You are at high risk for heart disease. What should I know about cancer screening? Depending on your health history and family history, you may need to have cancer screening at various ages. This may include screening for:  Breast cancer.  Cervical cancer.  Colorectal cancer.  Skin cancer.  Lung cancer. What should I know about heart disease, diabetes, and high blood pressure? Blood pressure and heart disease  High blood pressure causes heart disease and increases the risk of stroke. This is more likely to develop in people who have high blood pressure readings, are of African descent, or are overweight.  Have your blood pressure checked: ? Every 3-5 years if you are 49-5 years of age. ? Every year if you are 66 years old or older. Diabetes Have regular diabetes screenings. This checks your fasting blood sugar level. Have the screening done:  Once every three years after age 59 if you are at a normal weight and  have a low risk for diabetes.  More often and at a younger age if you are overweight or have a high risk for diabetes. What should I know about preventing infection? Hepatitis B If you have a higher risk for hepatitis B, you should be screened for this virus. Talk with your health care provider to find out if you are at risk for hepatitis B infection. Hepatitis C  Testing is recommended for:  Everyone born from [birth year] through 1963.  Anyone with known risk factors for hepatitis C. Sexually transmitted infections (STIs)  Get screened for STIs, including gonorrhea and chlamydia, if: ? You are sexually active and are younger than 21 years of age. ? You are older than 21 years of age and your health care provider tells you that you are at risk for this type of infection. ? Your sexual activity has changed since you were last screened, and you are at increased risk for chlamydia or gonorrhea. Ask your health care provider if you are at risk.  Ask your health care provider about whether you are at high risk for HIV. Your health care provider may recommend a prescription medicine to help prevent HIV infection. If you choose to take medicine to prevent HIV, you should first get tested for HIV. You should then be tested every 3 months for as long as you are taking the medicine. Pregnancy  If you are about to stop having your period (premenopausal) and you may become pregnant, seek counseling before you get pregnant.  Take 400 to 800 micrograms (mcg) of folic acid every day if you become pregnant.  Ask for birth control (contraception) if you want to prevent pregnancy. Osteoporosis and menopause Osteoporosis is a disease in which the bones lose minerals and strength with aging. This can result in bone fractures. If you are 49 years old or older, or if you are at risk for osteoporosis and fractures, ask your health care provider if you should:  Be screened for bone loss.  Take a calcium or vitamin D supplement to lower your risk of fractures.  Be given hormone replacement therapy (HRT) to treat symptoms of menopause. Follow these instructions at home: Lifestyle  Do not use any products that contain nicotine or tobacco, such as cigarettes, e-cigarettes, and chewing tobacco. If you need help quitting, ask your health care provider.  Do not use street drugs.   Do not share needles.  Ask your health care provider for help if you need support or information about quitting drugs. Alcohol use  Do not drink alcohol if: ? Your health care provider tells you not to drink. ? You are pregnant, may be pregnant, or are planning to become pregnant.  If you drink alcohol: ? Limit how much you use to 0-1 drink a day. ? Limit intake if you are breastfeeding.  Be aware of how much alcohol is in your drink. In the [place] one drink equals one 12 oz bottle of beer (355 mL), one 5 oz glass of wine (148 mL), or one 1 oz glass of hard liquor (44 mL). General instructions  Schedule regular health, dental, and eye exams.  Stay current with your vaccines.  Tell your health care provider if: ? You often feel depressed. ? You have ever been abused or do not feel safe at home. Summary  Adopting a healthy lifestyle and getting preventive care are important in promoting health and wellness.  Follow your health care provider's instructions about healthy diet, exercising, and getting  tested or screened for diseases.  Follow your health care provider's instructions on monitoring your cholesterol and blood pressure. This information is not intended to replace advice given to you by your health care provider. Make sure you discuss any questions you have with your health care provider. Document Released: 04/08/2011 Document Revised: 09/16/2018 Document Reviewed: 09/16/2018 Elsevier Patient Education  2020 [employer]

## 2019-08-31 ENCOUNTER — Other Ambulatory Visit: Payer: Self-pay

## 2019-08-31 ENCOUNTER — Ambulatory Visit (INDEPENDENT_AMBULATORY_CARE_PROVIDER_SITE_OTHER): Payer: 59 | Admitting: Internal Medicine

## 2019-08-31 ENCOUNTER — Encounter: Payer: Self-pay | Admitting: Internal Medicine

## 2019-08-31 VITALS — BP 128/70 | HR 77 | Temp 98.5°F | Resp 16 | Ht 62.0 in | Wt 165.0 lb

## 2019-08-31 DIAGNOSIS — E063 Autoimmune thyroiditis: Secondary | ICD-10-CM

## 2019-08-31 DIAGNOSIS — Z0000 Encounter for general adult medical examination without abnormal findings: Secondary | ICD-10-CM

## 2019-08-31 DIAGNOSIS — F4323 Adjustment disorder with mixed anxiety and depressed mood: Secondary | ICD-10-CM

## 2019-08-31 DIAGNOSIS — E7800 Pure hypercholesterolemia, unspecified: Secondary | ICD-10-CM | POA: Diagnosis not present

## 2019-08-31 MED ORDER — MOMETASONE FUROATE 0.1 % EX CREA: 1.0000 "application " | TOPICAL_CREAM | Freq: Every day | CUTANEOUS | 3 refills | Status: AC

## 2019-08-31 MED ORDER — TRIAMCINOLONE ACETONIDE 0.1 % EX OINT: TOPICAL_OINTMENT | CUTANEOUS | 1 refills | Status: AC

## 2019-08-31 MED ORDER — ESCITALOPRAM OXALATE 20 MG PO TABS: 20.0000 mg | ORAL_TABLET | Freq: Every day | ORAL | 1 refills | Status: DC

## 2019-08-31 NOTE — Assessment & Plan Note (Signed)
Experiencing some increase in anxiety and depression Names of therapists given Will try increasing Lexapro to 20 mg daily She will work on getting back into regular exercise Follow-up in 6 months, sooner if needed

## 2019-08-31 NOTE — Assessment & Plan Note (Addendum)
Clinically euthyroid Check TFTs Titrate med dose if needed

## 2019-08-31 NOTE — Assessment & Plan Note (Signed)
Check lipids 

## 2019-09-01 ENCOUNTER — Ambulatory Visit
Admission: RE | Admit: 2019-09-01 | Discharge: 2019-09-01 | Disposition: A | Discharge status: Home / Self Care | Payer: 59 | Source: Ambulatory Visit | Attending: Internal Medicine | Admitting: Internal Medicine

## 2019-09-01 DIAGNOSIS — N6320 Unspecified lump in the left breast, unspecified quadrant: Secondary | ICD-10-CM

## 2019-09-13 ENCOUNTER — Other Ambulatory Visit: Payer: Self-pay | Admitting: Internal Medicine

## 2019-09-14 LAB — CBC WITH DIFFERENTIAL/PLATELET
Basophils Absolute: 0.1 10*3/uL (ref 0.0–0.2)
Basos: 1 %
EOS (ABSOLUTE): 0.6 10*3/uL — ABNORMAL HIGH (ref 0.0–0.4)
Eos: 9 %
Hematocrit: 37.3 % (ref 34.0–46.6)
Hemoglobin: 12.2 g/dL (ref 11.1–15.9)
Immature Grans (Abs): 0 10*3/uL (ref 0.0–0.1)
Immature Granulocytes: 0 %
Lymphocytes Absolute: 2.5 10*3/uL (ref 0.7–3.1)
Lymphs: 35 %
MCH: 25.2 pg — ABNORMAL LOW (ref 26.6–33.0)
MCHC: 32.7 g/dL (ref 31.5–35.7)
MCV: 77 fL — ABNORMAL LOW (ref 79–97)
Monocytes Absolute: 0.4 10*3/uL (ref 0.1–0.9)
Monocytes: 5 %
Neutrophils Absolute: 3.6 10*3/uL (ref 1.4–7.0)
Neutrophils: 50 %
Platelets: 352 10*3/uL (ref 150–450)
RBC: 4.84 x10E6/uL (ref 3.77–5.28)
RDW: 12.7 % (ref 11.7–15.4)
WBC: 7.2 10*3/uL (ref 3.4–10.8)

## 2019-09-14 LAB — COMPREHENSIVE METABOLIC PANEL
ALT: 25 IU/L (ref 0–32)
AST: 28 IU/L (ref 0–40)
Albumin/Globulin Ratio: 1.6 (ref 1.2–2.2)
Albumin: 4 g/dL (ref 3.9–5.0)
Alkaline Phosphatase: 146 IU/L — ABNORMAL HIGH (ref 39–117)
BUN/Creatinine Ratio: 12 (ref 9–23)
BUN: 8 mg/dL (ref 6–20)
Bilirubin Total: 0.2 mg/dL (ref 0.0–1.2)
CO2: 20 mmol/L (ref 20–29)
Calcium: 9 mg/dL (ref 8.7–10.2)
Chloride: 102 mmol/L (ref 96–106)
Creatinine, Ser: 0.68 mg/dL (ref 0.57–1.00)
GFR calc Af Amer: 145 mL/min/{1.73_m2} (ref >59)
GFR calc non Af Amer: 125 mL/min/{1.73_m2} (ref >59)
Globulin, Total: 2.5 g/dL (ref 1.5–4.5)
Glucose: 76 mg/dL (ref 65–99)
Potassium: 4.3 mmol/L (ref 3.5–5.2)
Sodium: 135 mmol/L (ref 134–144)
Total Protein: 6.5 g/dL (ref 6.0–8.5)

## 2019-09-14 LAB — LIPID PANEL
Chol/HDL Ratio: 4.4 ratio (ref 0.0–4.4)
Cholesterol, Total: 237 mg/dL — ABNORMAL HIGH (ref 100–199)
HDL: 54 mg/dL (ref >39)
LDL Chol Calc (NIH): 153 mg/dL — ABNORMAL HIGH (ref 0–99)
Triglycerides: 167 mg/dL — ABNORMAL HIGH (ref 0–149)
VLDL Cholesterol Cal: 30 mg/dL (ref 5–40)

## 2019-09-14 LAB — T3, FREE: T3, Free: 2.7 pg/mL (ref 2.0–4.4)

## 2019-09-14 LAB — TSH: TSH: 4.59 u[IU]/mL — ABNORMAL HIGH (ref 0.450–4.500)

## 2019-09-14 LAB — T4, FREE: Free T4: 1.23 ng/dL (ref 0.82–1.77)

## 2019-09-15 ENCOUNTER — Encounter: Payer: Self-pay | Admitting: Internal Medicine

## 2019-09-15 MED ORDER — LEVOTHYROXINE SODIUM 125 MCG PO TABS: 125.0000 ug | ORAL_TABLET | Freq: Every day | ORAL | 0 refills | Status: DC

## 2019-09-15 NOTE — Addendum Note (Signed)
Addended by: Delice Bison E on: 09/15/2019 12:29 PM   Modules accepted: Orders

## 2019-11-01 ENCOUNTER — Encounter: Payer: Self-pay | Admitting: Internal Medicine

## 2019-12-10 ENCOUNTER — Telehealth: Payer: Self-pay | Admitting: Internal Medicine

## 2019-12-10 DIAGNOSIS — E063 Autoimmune thyroiditis: Secondary | ICD-10-CM

## 2019-12-10 NOTE — Telephone Encounter (Signed)
yes

## 2019-12-10 NOTE — Telephone Encounter (Signed)
New message:   Pt is needing to have her labs sent to her via email. Please advise.

## 2019-12-10 NOTE — Telephone Encounter (Signed)
Just tsh, t3, and t4?

## 2019-12-13 NOTE — Telephone Encounter (Signed)
Labs sent to email.

## 2019-12-15 ENCOUNTER — Other Ambulatory Visit: Payer: Self-pay | Admitting: Internal Medicine

## 2019-12-15 ENCOUNTER — Other Ambulatory Visit: Payer: Self-pay

## 2019-12-16 LAB — T4, FREE: Free T4: 1.33 ng/dL (ref 0.82–1.77)

## 2019-12-16 LAB — TSH: TSH: 0.685 u[IU]/mL (ref 0.450–4.500)

## 2019-12-16 LAB — T3, FREE: T3, Free: 2.9 pg/mL (ref 2.0–4.4)

## 2019-12-21 ENCOUNTER — Encounter: Payer: Self-pay | Admitting: Internal Medicine

## 2019-12-21 NOTE — Telephone Encounter (Signed)
Patient is calling and states [facility] will not fax over the forms again. The office would need to call and request them to fax over the labs again. Patient is asking if Ladona Ridgel has not received these results if she could do this.  She is also requesting a small refill until you get the results.

## 2019-12-22 MED ORDER — LEVOTHYROXINE SODIUM 125 MCG PO TABS: 125.0000 ug | ORAL_TABLET | Freq: Every day | ORAL | 1 refills | Status: DC

## 2020-01-18 IMAGING — US US BREAST*L* LIMITED INC AXILLA
1 series · 6 of 6 positions shown · non-contrast
Comparison: Previous exam(s).

CLINICAL DATA: Patient presents for 1 year follow-up of a probably
benign left breast mass.

EXAM:
ULTRASOUND OF THE LEFT BREAST

[Series 1: us breast*left* limited inc axilla · 0.06mm/px · 6 of 6 slices shown]
[im 1/6]
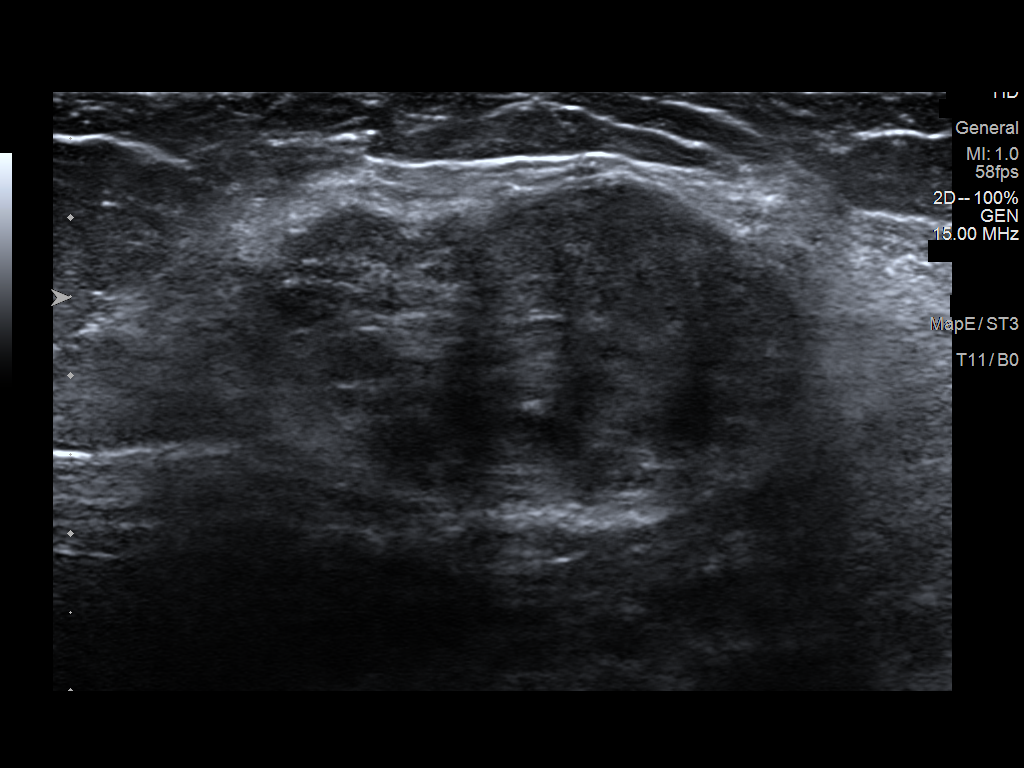
[im 2/6]
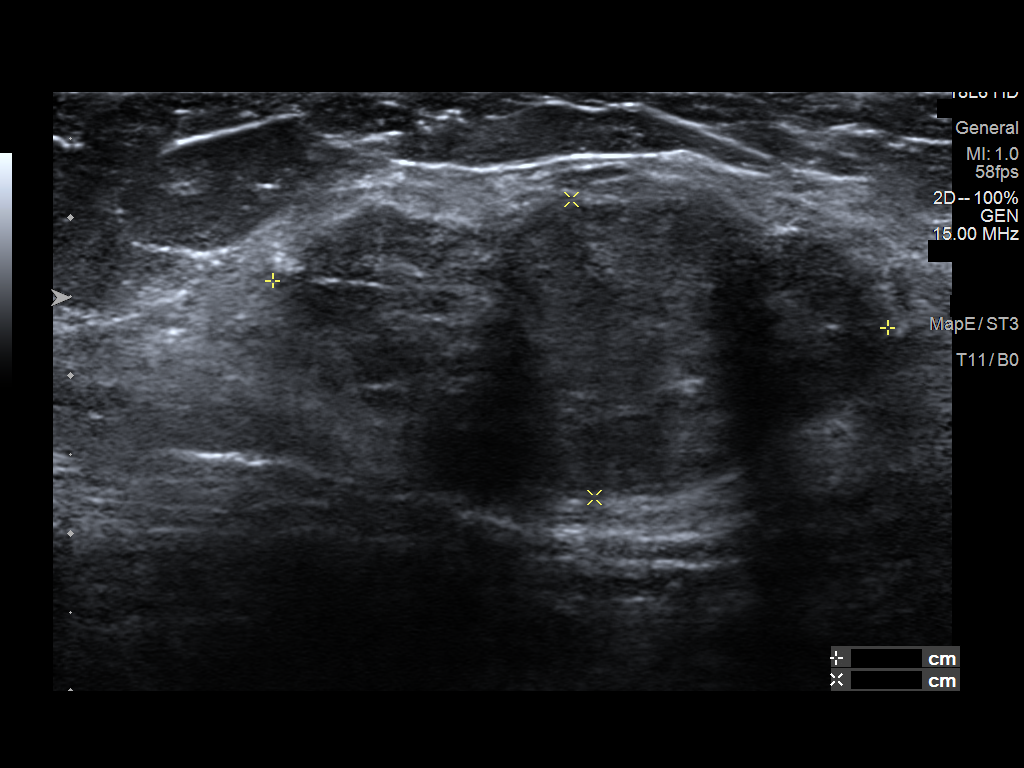
[im 3/6]
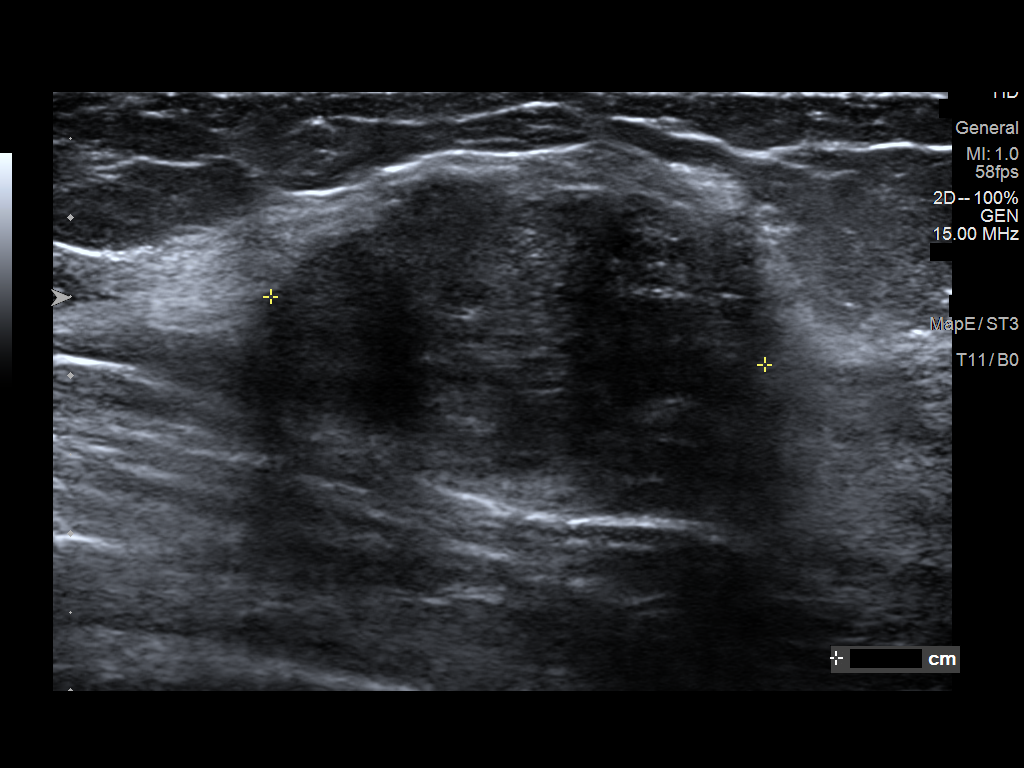
[im 4/6]
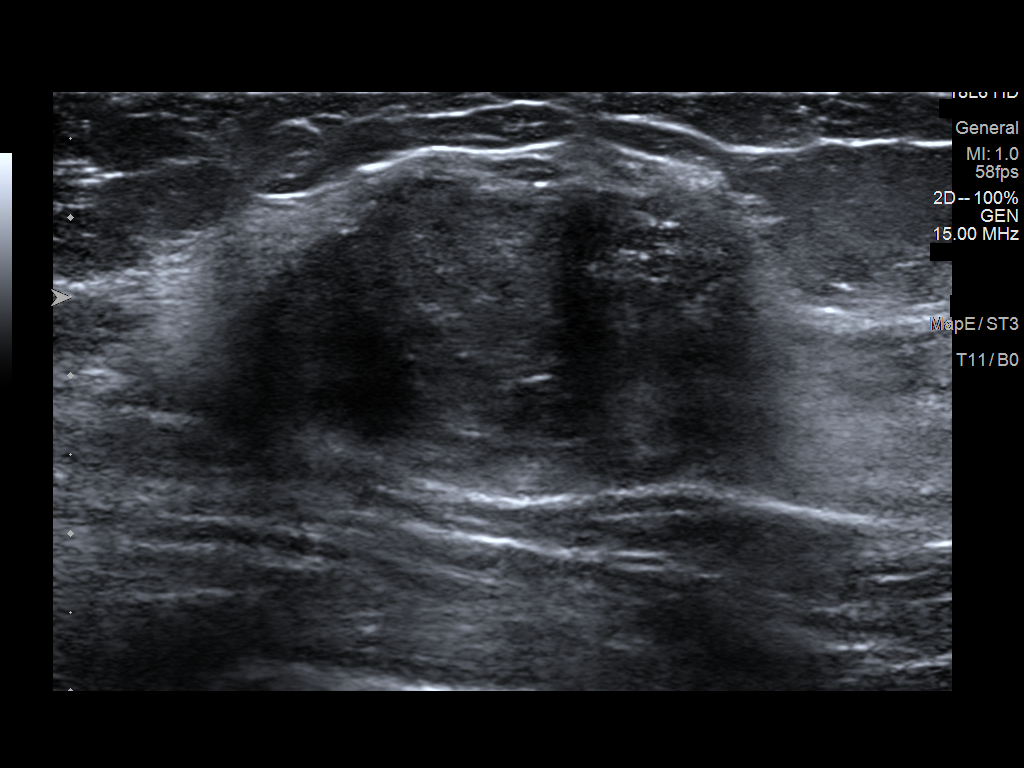
[im 5/6]
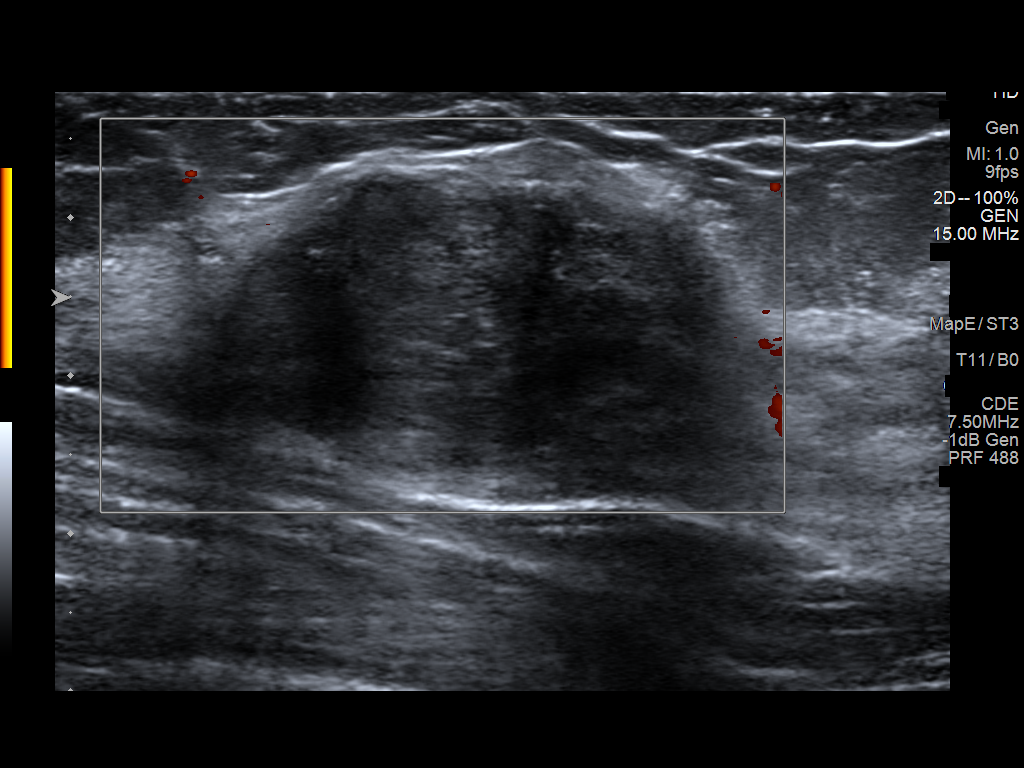
[im 6/6]
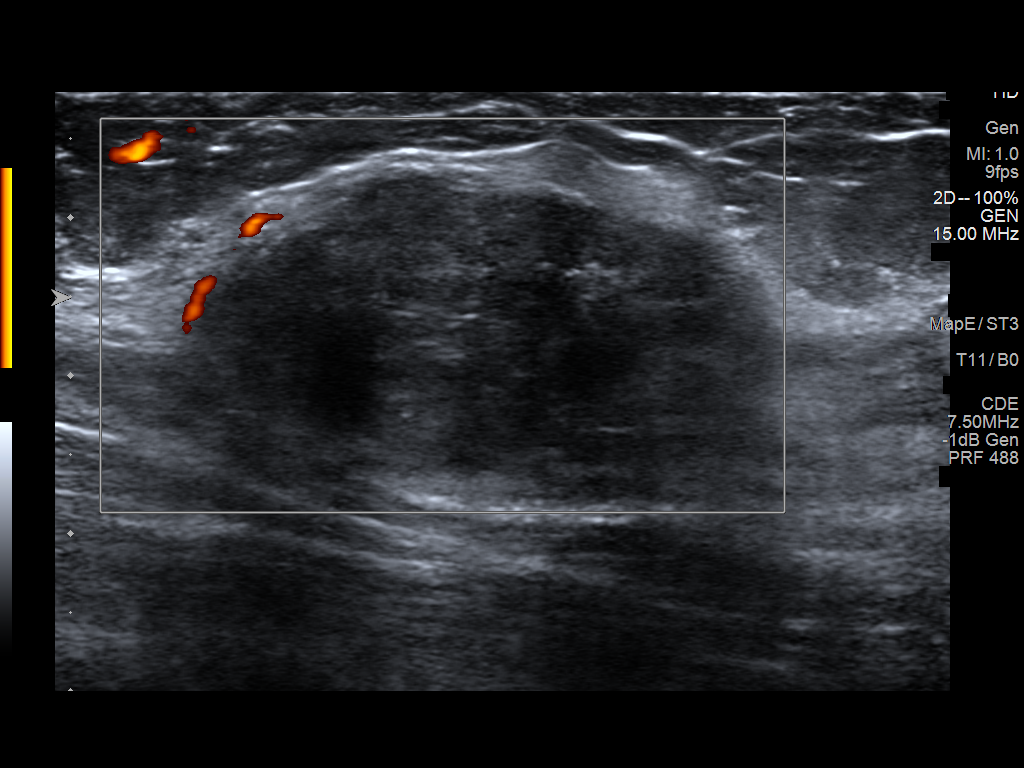

[6 of 6 positions shown; findings below may reference images not displayed]

FINDINGS: Targeted ultrasound is performed in the left breast at 2 o'clock 7
cm from the nipple demonstrating an oval circumscribed hypoechoic
mass measuring 3.9 x 1.9 x 3.2 cm, previously measuring 3.9 x 1.7 x
3.2 cm.
IMPRESSION: Stable appearance of a probably benign left breast mass at 2
o'clock, most likely a fibroadenoma.

RECOMMENDATION:
Left breast ultrasound in 1 year to confirm 2 year stability of the
probably benign left breast mass.

I have discussed the findings and recommendations with the patient.
If applicable, a reminder letter will be sent to the patient
regarding the next appointment.

BI-RADS CATEGORY  3: Probably benign.

## 2020-02-28 ENCOUNTER — Other Ambulatory Visit: Payer: Self-pay

## 2020-02-28 ENCOUNTER — Encounter: Payer: Self-pay | Admitting: Internal Medicine

## 2020-02-28 ENCOUNTER — Telehealth: Payer: Self-pay | Admitting: Internal Medicine

## 2020-02-28 ENCOUNTER — Other Ambulatory Visit: Payer: Self-pay | Admitting: Internal Medicine

## 2020-02-28 NOTE — Telephone Encounter (Signed)
Done.    My chart message sent.

## 2020-02-28 NOTE — Telephone Encounter (Signed)
New message:   1.Medication Requested: escitalopram (LEXAPRO) 20 MG tablet 2. Pharmacy (Name, Street, Industry): [id number] - [place], [place] - [address] 3. On Med List: Yes  4. Last Visit with PCP: 08/31/19  5. Next visit date with PCP: None  Pt states she is getting ready to go out of town tomorrow and needs this medication before she leaves. Please advise.   Agent: Please be advised that RX refills may take up to 3 business days. We ask that you follow-up with your pharmacy.

## 2020-03-15 ENCOUNTER — Telehealth: Payer: Self-pay | Admitting: Internal Medicine

## 2020-03-15 DIAGNOSIS — E063 Autoimmune thyroiditis: Secondary | ICD-10-CM

## 2020-03-15 NOTE — Telephone Encounter (Signed)
    Patient requesting lab order for TSH, T4, T3 be mailed to her temporary address on file in [place]

## 2020-03-15 NOTE — Telephone Encounter (Signed)
Labs ordered-printed

## 2020-03-15 NOTE — Telephone Encounter (Signed)
Put in bin to go out today.

## 2020-03-20 ENCOUNTER — Encounter: Payer: Self-pay | Admitting: Internal Medicine

## 2020-03-23 ENCOUNTER — Other Ambulatory Visit: Payer: Self-pay | Admitting: Internal Medicine

## 2020-03-24 ENCOUNTER — Encounter: Payer: Self-pay | Admitting: Internal Medicine

## 2020-03-24 LAB — TSH: TSH: 0.394 u[IU]/mL — ABNORMAL LOW (ref 0.450–4.500)

## 2020-03-24 LAB — T3, FREE: T3, Free: 3.2 pg/mL (ref 2.0–4.4)

## 2020-03-24 LAB — T4, FREE: Free T4: 1.45 ng/dL (ref 0.82–1.77)

## 2020-03-26 ENCOUNTER — Other Ambulatory Visit: Payer: Self-pay | Admitting: Internal Medicine

## 2020-03-26 DIAGNOSIS — E063 Autoimmune thyroiditis: Secondary | ICD-10-CM

## 2020-03-26 MED ORDER — LEVOTHYROXINE SODIUM 125 MCG PO TABS: ORAL_TABLET | ORAL | 1 refills | Status: AC

## 2020-07-02 ENCOUNTER — Encounter: Payer: Self-pay | Admitting: Internal Medicine

## 2020-08-29 ENCOUNTER — Other Ambulatory Visit: Payer: Self-pay | Admitting: Internal Medicine

## 2020-09-12 ENCOUNTER — Other Ambulatory Visit: Payer: Self-pay | Admitting: Internal Medicine
